# Patient Record
Sex: Female | Born: 1965 | Race: Black or African American | Hispanic: No | Marital: Married | Smoking: Former smoker
Health system: Southern US, Community
[De-identification: ages and names within clinical notes are randomized; demographics above are authoritative.]

## PROBLEM LIST (undated history)

## (undated) DIAGNOSIS — M329 Systemic lupus erythematosus, unspecified: Secondary | ICD-10-CM

## (undated) DIAGNOSIS — B029 Zoster without complications: Secondary | ICD-10-CM

## (undated) DIAGNOSIS — D649 Anemia, unspecified: Secondary | ICD-10-CM

## (undated) DIAGNOSIS — E785 Hyperlipidemia, unspecified: Secondary | ICD-10-CM

## (undated) DIAGNOSIS — D563 Thalassemia minor: Secondary | ICD-10-CM

## (undated) DIAGNOSIS — I10 Essential (primary) hypertension: Secondary | ICD-10-CM

## (undated) DIAGNOSIS — E119 Type 2 diabetes mellitus without complications: Secondary | ICD-10-CM

## (undated) DIAGNOSIS — IMO0002 Reserved for concepts with insufficient information to code with codable children: Secondary | ICD-10-CM

## (undated) HISTORY — DX: Thalassemia minor: D56.3

## (undated) HISTORY — DX: Systemic lupus erythematosus, unspecified: M32.9

## (undated) HISTORY — PX: ABDOMINAL HYSTERECTOMY: SHX81

---

## 2012-12-24 ENCOUNTER — Emergency Department (HOSPITAL_BASED_OUTPATIENT_CLINIC_OR_DEPARTMENT_OTHER)
Admission: EM | Admit: 2012-12-24 | Discharge: 2012-12-24 | Disposition: A | Discharge status: Home / Self Care | Payer: Managed Care, Other (non HMO) | Attending: Emergency Medicine | Admitting: Emergency Medicine

## 2012-12-24 ENCOUNTER — Encounter (HOSPITAL_BASED_OUTPATIENT_CLINIC_OR_DEPARTMENT_OTHER): Payer: Self-pay | Admitting: Emergency Medicine

## 2012-12-24 DIAGNOSIS — Z90710 Acquired absence of both cervix and uterus: Secondary | ICD-10-CM | POA: Insufficient documentation

## 2012-12-24 DIAGNOSIS — R110 Nausea: Secondary | ICD-10-CM | POA: Insufficient documentation

## 2012-12-24 DIAGNOSIS — R51 Headache: Secondary | ICD-10-CM | POA: Insufficient documentation

## 2012-12-24 DIAGNOSIS — Z3202 Encounter for pregnancy test, result negative: Secondary | ICD-10-CM | POA: Insufficient documentation

## 2012-12-24 DIAGNOSIS — R109 Unspecified abdominal pain: Secondary | ICD-10-CM

## 2012-12-24 DIAGNOSIS — R079 Chest pain, unspecified: Secondary | ICD-10-CM | POA: Insufficient documentation

## 2012-12-24 DIAGNOSIS — D649 Anemia, unspecified: Secondary | ICD-10-CM | POA: Insufficient documentation

## 2012-12-24 DIAGNOSIS — R1084 Generalized abdominal pain: Secondary | ICD-10-CM | POA: Insufficient documentation

## 2012-12-24 LAB — LIPASE, BLOOD: Lipase: 25 U/L (ref 11–59)

## 2012-12-24 LAB — CBC WITH DIFFERENTIAL/PLATELET
Basophils Absolute: 0 10*3/uL (ref 0.0–0.1)
Hemoglobin: 10.1 g/dL — ABNORMAL LOW (ref 12.0–15.0)
Lymphocytes Relative: 14 % (ref 12–46)
Lymphs Abs: 0.5 10*3/uL — ABNORMAL LOW (ref 0.7–4.0)
MCH: 23.1 pg — ABNORMAL LOW (ref 26.0–34.0)
Monocytes Relative: 7 % (ref 3–12)
Neutro Abs: 2.9 10*3/uL (ref 1.7–7.7)
Neutrophils Relative %: 79 % — ABNORMAL HIGH (ref 43–77)
RBC: 4.37 MIL/uL (ref 3.87–5.11)
RDW: 15.6 % — ABNORMAL HIGH (ref 11.5–15.5)

## 2012-12-24 LAB — COMPREHENSIVE METABOLIC PANEL
AST: 21 U/L (ref 0–37)
Alkaline Phosphatase: 60 U/L (ref 39–117)
BUN: 14 mg/dL (ref 6–23)
CO2: 26 mEq/L (ref 19–32)
Calcium: 10.3 mg/dL (ref 8.4–10.5)
Chloride: 100 mEq/L (ref 96–112)
GFR calc Af Amer: 88 mL/min — ABNORMAL LOW (ref >90)
Glucose, Bld: 93 mg/dL (ref 70–99)
Potassium: 3.9 mEq/L (ref 3.5–5.1)
Sodium: 137 mEq/L (ref 135–145)
Total Bilirubin: 0.2 mg/dL — ABNORMAL LOW (ref 0.3–1.2)

## 2012-12-24 LAB — URINALYSIS, ROUTINE W REFLEX MICROSCOPIC
Bilirubin Urine: NEGATIVE
Ketones, ur: NEGATIVE mg/dL
Nitrite: NEGATIVE
Specific Gravity, Urine: 1.007 (ref 1.005–1.030)
pH: 6.5 (ref 5.0–8.0)

## 2012-12-24 MED ORDER — ONDANSETRON HCL 4 MG/2ML IJ SOLN
4.0000 mg | Freq: Once | INTRAMUSCULAR | Status: AC
  Administered 2012-12-24: 4 mg via INTRAVENOUS
  Filled 2012-12-24: qty 2

## 2012-12-24 MED ORDER — HYDROMORPHONE HCL PF 1 MG/ML IJ SOLN
0.5000 mg | Freq: Once | INTRAMUSCULAR | Status: AC
  Administered 2012-12-24: 0.5 mg via INTRAVENOUS
  Filled 2012-12-24: qty 1

## 2012-12-24 MED ORDER — SODIUM CHLORIDE 0.9 % IV SOLN
1000.0000 mL | Freq: Once | INTRAVENOUS | Status: AC
  Administered 2012-12-24: 1000 mL via INTRAVENOUS

## 2012-12-24 MED ORDER — ONDANSETRON 4 MG PO TBDP: 4.0000 mg | ORAL_TABLET | Freq: Three times a day (TID) | ORAL | Status: DC | PRN

## 2012-12-24 NOTE — ED Notes (Signed)
Pt c/o abd pain with nausea and h/a x 2 days

## 2012-12-24 NOTE — ED Provider Notes (Signed)
CSN: [id number]     Arrival date & time 12/24/12  1841 History   First MD Initiated Contact with Patient 12/24/12 1853     This chart was scribed for Gerhard Munch, MD by Ellin Mayhew, ED Scribe and Bennett Scrape, ED Scribe. This patient was seen in room MH05/MH05 and the patient's care was started at 7:06 PM.  Chief Complaint  Patient presents with  . Abdominal Pain   The history is provided by the patient. No language interpreter was used.    HPI Comments: Stephanie Myers is a 47 y.o. female who presents to the Emergency Department complaining of intermittent, diffuse abdominal pain described as soreness with associated HA that began two days ago. She rates the abdominal pain as a 4/10. She has taken ibuprofen for her HA but has experienced intermittent nausea without vomiting. Patient states that she has begun to feel some relief from the nausea beginning yesterday. She reports taking 2 Meclizine pills about 2 hours ago with no improvement. Patient has been around sick contacts with flu-like symptoms around her house. She denies any fever, vomiting, diarrhea, rash, leg swelling, urinary sxs or SOB. Patient has had mild intermittent upper chest pain that she deems "nothing serious". She denies drinking and/or smoking. Patient has had an abdominal hysterectomy in 2005-2006. She has no h/o chronic medical conditions.  History reviewed. No pertinent past medical history. Past Surgical History  Procedure Laterality Date  . Abdominal hysterectomy     History reviewed. No pertinent family history. History  Substance Use Topics  . Smoking status: Never Smoker   . Smokeless tobacco: Not on file  . Alcohol Use: No   OB History   Grav Para Term Preterm Abortions TAB SAB Ect Mult Living                 Review of Systems  A complete 10 system review of systems was obtained and all systems are negative except as noted in the HPI and PMH.   Allergies  Review of patient's allergies  indicates no known allergies.  Home Medications  No current outpatient prescriptions on file. Triage Vitals; BP 151/96  Pulse 81  Temp(Src) 98.6 F (37 C) (Oral)  Resp 16  Ht 5\' 4"  (1.626 m)  Wt 180 lb (81.647 kg)  BMI 30.88 kg/m2  SpO2 100%  Physical Exam  Nursing note and vitals reviewed. Constitutional: She is oriented to person, place, and time. She appears well-developed and well-nourished. No distress.  HENT:  Head: Normocephalic and atraumatic.  Eyes: Conjunctivae are normal. Pupils are equal, round, and reactive to light.  Neck: Normal range of motion. Neck supple.  Cardiovascular: Normal rate, regular rhythm and normal heart sounds.   Pulmonary/Chest: Effort normal and breath sounds normal.  Abdominal: Soft. Bowel sounds are normal. There is no tenderness.  Musculoskeletal: She exhibits no edema and no tenderness.  Neurological: She is alert and oriented to person, place, and time.  Skin: No rash noted.     ED Course  Procedures (including critical care time)  Medications  ondansetron (ZOFRAN) injection 4 mg (4 mg Intravenous Given 12/24/12 1952)  0.9 %  sodium chloride infusion (1,000 mLs Intravenous New Bag/Given 12/24/12 1943)  HYDROmorphone (DILAUDID) injection 0.5 mg (0.5 mg Intravenous Given 12/24/12 1955)   DIAGNOSTIC STUDIES: Oxygen Saturation is 100% on room air, normal by my interpretation.    COORDINATION OF CARE: 7:12 PM-Discussed treatment plans to order a UA and pain medication with patient at bedside and patient  agrees.  Labs Review Labs Reviewed  CBC WITH DIFFERENTIAL - Abnormal; Notable for the following:    WBC 3.7 (*)    Hemoglobin 10.1 (*)    HCT 32.2 (*)    MCV 73.7 (*)    MCH 23.1 (*)    RDW 15.6 (*)    Neutrophils Relative % 79 (*)    Lymphs Abs 0.5 (*)    All other components within normal limits  COMPREHENSIVE METABOLIC PANEL - Abnormal; Notable for the following:    Total Bilirubin 0.2 (*)    GFR calc non Af Amer 76 (*)     GFR calc Af Amer 88 (*)    All other components within normal limits  URINALYSIS, ROUTINE W REFLEX MICROSCOPIC  LIPASE, BLOOD  PREGNANCY, URINE   Imaging Review No results found.  EKG Interpretation   None      9:07 PM Patient reassessed.  She states that she is feeling better.  She, her husband and I had a lengthy conversation will results, including her evidence of anemia.  She has a PMD with whom she can f/u.  MDM  No diagnosis found.  I personally performed the services described in this documentation, which was scribed in my presence. The recorded information has been reviewed and is accurate.   Patient presents with several days of generalized abdominal discomfort, nausea. On exam she is awake alert, hemodynamically stable, afebrile with a soft, non-peritoneal abdomen.  Patient had some improvement here.  Patient's labs are notable for no leukocytosis, no significant electrolyte abnormalities.  Patient does have evidence of anemia, which we discussed at length.  With some improvement here, with no evidence of decompensation, patient was discharged in stable condition with primary care followup.  She will be started on multivitamin, iron, antinausea medication.  Gerhard Munch, MD 12/24/12 2109

## 2013-07-03 ENCOUNTER — Emergency Department (HOSPITAL_BASED_OUTPATIENT_CLINIC_OR_DEPARTMENT_OTHER): Payer: Managed Care, Other (non HMO)

## 2013-07-03 ENCOUNTER — Emergency Department (HOSPITAL_BASED_OUTPATIENT_CLINIC_OR_DEPARTMENT_OTHER)
Admission: EM | Admit: 2013-07-03 | Discharge: 2013-07-03 | Disposition: A | Discharge status: Home / Self Care | Payer: Managed Care, Other (non HMO) | Attending: Emergency Medicine | Admitting: Emergency Medicine

## 2013-07-03 ENCOUNTER — Encounter (HOSPITAL_BASED_OUTPATIENT_CLINIC_OR_DEPARTMENT_OTHER): Payer: Self-pay | Admitting: Emergency Medicine

## 2013-07-03 DIAGNOSIS — I10 Essential (primary) hypertension: Secondary | ICD-10-CM | POA: Insufficient documentation

## 2013-07-03 DIAGNOSIS — J159 Unspecified bacterial pneumonia: Secondary | ICD-10-CM | POA: Insufficient documentation

## 2013-07-03 DIAGNOSIS — J189 Pneumonia, unspecified organism: Secondary | ICD-10-CM

## 2013-07-03 DIAGNOSIS — D649 Anemia, unspecified: Secondary | ICD-10-CM | POA: Insufficient documentation

## 2013-07-03 DIAGNOSIS — Z79899 Other long term (current) drug therapy: Secondary | ICD-10-CM | POA: Insufficient documentation

## 2013-07-03 DIAGNOSIS — Z7982 Long term (current) use of aspirin: Secondary | ICD-10-CM | POA: Insufficient documentation

## 2013-07-03 DIAGNOSIS — E119 Type 2 diabetes mellitus without complications: Secondary | ICD-10-CM | POA: Insufficient documentation

## 2013-07-03 HISTORY — DX: Anemia, unspecified: D64.9

## 2013-07-03 HISTORY — DX: Essential (primary) hypertension: I10

## 2013-07-03 HISTORY — DX: Type 2 diabetes mellitus without complications: E11.9

## 2013-07-03 LAB — CBC
HEMATOCRIT: 29.5 % — AB (ref 36.0–46.0)
HEMOGLOBIN: 9.4 g/dL — AB (ref 12.0–15.0)
MCH: 23 pg — ABNORMAL LOW (ref 26.0–34.0)
MCHC: 31.9 g/dL (ref 30.0–36.0)
MCV: 72.1 fL — AB (ref 78.0–100.0)
Platelets: 217 10*3/uL (ref 150–400)
RBC: 4.09 MIL/uL (ref 3.87–5.11)
RDW: 16 % — AB (ref 11.5–15.5)
WBC: 4.7 10*3/uL (ref 4.0–10.5)

## 2013-07-03 LAB — TROPONIN I

## 2013-07-03 LAB — PRO B NATRIURETIC PEPTIDE: Pro B Natriuretic peptide (BNP): 128.3 pg/mL — ABNORMAL HIGH (ref 0–125)

## 2013-07-03 MED ORDER — LEVOFLOXACIN 500 MG PO TABS: 500.0000 mg | ORAL_TABLET | Freq: Every day | ORAL | Status: AC

## 2013-07-03 MED ORDER — ASPIRIN 81 MG PO CHEW
324.0000 mg | CHEWABLE_TABLET | Freq: Once | ORAL | Status: AC
  Administered 2013-07-03: 324 mg via ORAL
  Filled 2013-07-03: qty 4

## 2013-07-03 MED ORDER — LEVOFLOXACIN 500 MG PO TABS
500.0000 mg | ORAL_TABLET | Freq: Once | ORAL | Status: AC
  Administered 2013-07-03: 500 mg via ORAL
  Filled 2013-07-03: qty 1

## 2013-07-03 NOTE — ED Provider Notes (Signed)
CSN: [id number]     Arrival date & time 07/03/13  0703 History   First MD Initiated Contact with Patient 07/03/13 [phone]     Chief Complaint  Patient presents with  . Chest Pain  . Fever      HPI  Patient presents with concerns of fevers, chills, chest pain, mild dyspnea. Symptoms began approximately one day ago.  Since onset she has had all the aforementioned symptoms. Chest pain is anterior, pressure like, diffuse. No relief with anything. Patient has not taken more than OTC medication for relief. Patient does not smoke, does not drink, has no cardiac history, no family history of early cardiac disease, no travel history, no history of thromboembolic processes.   Past Medical History  Diagnosis Date  . Hypertension   . Diabetes mellitus without complication   . Anemia    Past Surgical History  Procedure Laterality Date  . Abdominal hysterectomy     No family history on file. History  Substance Use Topics  . Smoking status: Never Smoker   . Smokeless tobacco: Not on file  . Alcohol Use: No   OB History   Grav Para Term Preterm Abortions TAB SAB Ect Mult Living                 Review of Systems  Constitutional:       Per HPI, otherwise negative  HENT:       Per HPI, otherwise negative  Respiratory:       Per HPI, otherwise negative  Cardiovascular:       Per HPI, otherwise negative  Gastrointestinal: Negative for vomiting.  Endocrine:       Negative aside from HPI  Genitourinary:       Neg aside from HPI   Musculoskeletal:       Per HPI, otherwise negative  Skin: Negative.   Neurological: Negative for syncope.      Allergies  Review of patient's allergies indicates no known allergies.  Home Medications   Prior to Admission medications   Medication Sig Start Date End Date Taking? Authorizing Provider  aspirin 81 MG tablet Take 81 mg by mouth daily.   Yes Historical Provider, MD  cetirizine (ZYRTEC) 10 MG tablet Take 10 mg by mouth daily.   Yes  Historical Provider, MD  ferrous sulfate 325 (65 FE) MG tablet Take 325 mg by mouth daily with breakfast.   Yes Historical Provider, MD  levofloxacin (LEVAQUIN) 500 MG tablet Take 1 tablet (500 mg total) by mouth daily. 07/04/13 07/09/13  Gerhard Munchobert Lockwood, MD   BP 140/94  Pulse 84  Temp(Src) 99.8 F (37.7 C) (Oral)  Resp 16  Ht 5\' 4"  (1.626 m)  Wt 180 lb (81.647 kg)  BMI 30.88 kg/m2  SpO2 99% Physical Exam  Nursing note and vitals reviewed. Constitutional: She is oriented to person, place, and time. She appears well-developed and well-nourished. No distress.  HENT:  Head: Normocephalic and atraumatic.  Eyes: Conjunctivae and EOM are normal.  Cardiovascular: Normal rate and regular rhythm.   Pulmonary/Chest: Effort normal and breath sounds normal. No stridor. No respiratory distress.  Abdominal: She exhibits no distension.  Musculoskeletal: She exhibits no edema.  Neurological: She is alert and oriented to person, place, and time. No cranial nerve deficit.  Skin: Skin is warm and dry.  Psychiatric: She has a normal mood and affect.    ED Course  Procedures (including critical care time) Labs Review Labs Reviewed  CBC - Abnormal; Notable for the  following:    Hemoglobin 9.4 (*)    HCT 29.5 (*)    MCV 72.1 (*)    MCH 23.0 (*)    RDW 16.0 (*)    All other components within normal limits  PRO B NATRIURETIC PEPTIDE - Abnormal; Notable for the following:    Pro B Natriuretic peptide (BNP) 128.3 (*)    All other components within normal limits  TROPONIN I    Imaging Review Dg Chest 2 View  07/03/2013   CLINICAL DATA:  Chest pain with shortness of breath.  EXAM: CHEST  2 VIEW  COMPARISON:  None.  FINDINGS: The lungs are mildly hypoinflated. Increased lung markings are present in the left lower lobe. The cardiac silhouette is top-normal in size. The pulmonary vascularity is not engorged. There is no pleural effusion. The bony thorax is unremarkable.  IMPRESSION: Left lower lobe  atelectasis or early pneumonia.  No CHF.   Electronically Signed   By: David  [place]   On: 07/03/2013 07:37   I reviewed the x-ray, agree with the interpretation. I discussed and demonstrated with the patient and her daughter.     EKG Interpretation   Date/Time:  Friday July 03 2013 07:12:44 EDT Ventricular Rate:  87 PR Interval:  164 QRS Duration: 70 QT Interval:  358 QTC Calculation: 430 R Axis:   60 Text Interpretation:  Normal sinus rhythm Normal ECG Sinus rhythm Normal  ECG Confirmed by Gerhard MunchLOCKWOOD, ROBERT  MD ([id number]) on 07/03/2013 8:20:47 AM      MDM   Final diagnoses:  CAP (community acquired pneumonia)    Patient presents with one day of fever, dyspnea, chest pressure. Patient has no ischemic changes on EKG, and negative troponin, and now she has risk factors hypertension diabetes, with the passage of one day since onset of symptoms, these findings likely reflect the absence of ongoing coronary ischemia. Patient has no risk factors for PE, and is not hypoxic, tachycardic. The description of fevers, chills, mild dyspnea and chest pressure, the patient's x-ray findings of pneumonia seems causative. Absent distress the patient is appropriate for further evaluation and management as an outpatient.  Patient has a primary care physician with whom she can followup after initiation of antibiotics here.    Gerhard Munchobert Lockwood, MD 07/03/13 [phone]

## 2013-07-03 NOTE — ED Notes (Signed)
Mid upper chest pain since last night with fever, sob.  Pt states she is having some swelling in hands.  Pain increases with deep breath.  No recent travel.  No edema in legs.

## 2013-07-03 NOTE — Discharge Instructions (Signed)
As discussed, today's evaluation has resulted in a diagnosis of pneumonia.  It is important that you monitor your condition carefully, take all medication as directed, and be sure to followup with your primary care physician next week.  Return here for any concerning changes in your condition.

## 2013-07-21 ENCOUNTER — Emergency Department (HOSPITAL_BASED_OUTPATIENT_CLINIC_OR_DEPARTMENT_OTHER): Payer: Managed Care, Other (non HMO)

## 2013-07-21 ENCOUNTER — Emergency Department (HOSPITAL_BASED_OUTPATIENT_CLINIC_OR_DEPARTMENT_OTHER)
Admission: EM | Admit: 2013-07-21 | Discharge: 2013-07-21 | Disposition: A | Discharge status: Home / Self Care | Payer: Managed Care, Other (non HMO) | Attending: Emergency Medicine | Admitting: Emergency Medicine

## 2013-07-21 ENCOUNTER — Encounter (HOSPITAL_BASED_OUTPATIENT_CLINIC_OR_DEPARTMENT_OTHER): Payer: Self-pay | Admitting: Emergency Medicine

## 2013-07-21 DIAGNOSIS — D649 Anemia, unspecified: Secondary | ICD-10-CM | POA: Insufficient documentation

## 2013-07-21 DIAGNOSIS — R071 Chest pain on breathing: Secondary | ICD-10-CM | POA: Insufficient documentation

## 2013-07-21 DIAGNOSIS — I10 Essential (primary) hypertension: Secondary | ICD-10-CM | POA: Insufficient documentation

## 2013-07-21 DIAGNOSIS — E119 Type 2 diabetes mellitus without complications: Secondary | ICD-10-CM | POA: Insufficient documentation

## 2013-07-21 DIAGNOSIS — Z79899 Other long term (current) drug therapy: Secondary | ICD-10-CM | POA: Insufficient documentation

## 2013-07-21 DIAGNOSIS — Z8701 Personal history of pneumonia (recurrent): Secondary | ICD-10-CM | POA: Insufficient documentation

## 2013-07-21 DIAGNOSIS — R0789 Other chest pain: Secondary | ICD-10-CM

## 2013-07-21 DIAGNOSIS — Z7982 Long term (current) use of aspirin: Secondary | ICD-10-CM | POA: Insufficient documentation

## 2013-07-21 LAB — CBC WITH DIFFERENTIAL/PLATELET
BAND NEUTROPHILS: 11 % — AB (ref 0–10)
BLASTS: 0 %
Basophils Absolute: 0 10*3/uL (ref 0.0–0.1)
Basophils Relative: 0 % (ref 0–1)
EOS ABS: 0.2 10*3/uL (ref 0.0–0.7)
EOS PCT: 3 % (ref 0–5)
HEMATOCRIT: 31.2 % — AB (ref 36.0–46.0)
Hemoglobin: 9.9 g/dL — ABNORMAL LOW (ref 12.0–15.0)
Lymphocytes Relative: 16 % (ref 12–46)
Lymphs Abs: 0.8 10*3/uL (ref 0.7–4.0)
MCH: 22.8 pg — ABNORMAL LOW (ref 26.0–34.0)
MCHC: 31.7 g/dL (ref 30.0–36.0)
MCV: 71.7 fL — ABNORMAL LOW (ref 78.0–100.0)
MONO ABS: 0.3 10*3/uL (ref 0.1–1.0)
MONOS PCT: 6 % (ref 3–12)
Metamyelocytes Relative: 0 %
Myelocytes: 5 %
Neutro Abs: 3.9 10*3/uL (ref 1.7–7.7)
Neutrophils Relative %: 59 % (ref 43–77)
Platelets: 280 10*3/uL (ref 150–400)
Promyelocytes Absolute: 0 %
RBC: 4.35 MIL/uL (ref 3.87–5.11)
RDW: 17 % — ABNORMAL HIGH (ref 11.5–15.5)
WBC: 5.2 10*3/uL (ref 4.0–10.5)
nRBC: 0 /100 WBC

## 2013-07-21 LAB — BASIC METABOLIC PANEL
Anion gap: 14 (ref 5–15)
BUN: 17 mg/dL (ref 6–23)
CO2: 26 mEq/L (ref 19–32)
Calcium: 9.5 mg/dL (ref 8.4–10.5)
Chloride: 98 mEq/L (ref 96–112)
Creatinine, Ser: 0.9 mg/dL (ref 0.50–1.10)
GFR calc Af Amer: 87 mL/min — ABNORMAL LOW (ref >90)
GFR calc non Af Amer: 75 mL/min — ABNORMAL LOW (ref >90)
Glucose, Bld: 129 mg/dL — ABNORMAL HIGH (ref 70–99)
POTASSIUM: 3.9 meq/L (ref 3.7–5.3)
Sodium: 138 mEq/L (ref 137–147)

## 2013-07-21 LAB — TROPONIN I: Troponin I: <0.3 ng/mL (ref <0.30)

## 2013-07-21 LAB — D-DIMER, QUANTITATIVE: D-Dimer, Quant: 1.23 ug/mL-FEU — ABNORMAL HIGH (ref 0.00–0.48)

## 2013-07-21 MED ORDER — IOHEXOL 350 MG/ML SOLN
100.0000 mL | Freq: Once | INTRAVENOUS | Status: AC | PRN
  Administered 2013-07-21: 100 mL via INTRAVENOUS

## 2013-07-21 MED ORDER — HYDROCODONE-ACETAMINOPHEN 5-325 MG PO TABS: 1.0000 | ORAL_TABLET | Freq: Four times a day (QID) | ORAL | Status: DC | PRN

## 2013-07-21 MED ORDER — IBUPROFEN 600 MG PO TABS
600.0000 mg | ORAL_TABLET | Freq: Four times a day (QID) | ORAL | Status: DC | PRN
Start: 2013-07-21 — End: 2014-07-16

## 2013-07-21 MED ORDER — KETOROLAC TROMETHAMINE 60 MG/2ML IM SOLN
60.0000 mg | Freq: Once | INTRAMUSCULAR | Status: AC
  Administered 2013-07-21: 60 mg via INTRAMUSCULAR
  Filled 2013-07-21: qty 2

## 2013-07-21 NOTE — ED Notes (Signed)
Recent dx with PNA. Pt went back to follow up with PMD, now having recurrent CP in right chest and radiating down right arm. Reports [facility].

## 2013-07-21 NOTE — Discharge Instructions (Signed)
Costochondritis Costochondritis, sometimes called Tietze syndrome, is a swelling and irritation (inflammation) of the tissue (cartilage) that connects your ribs with your breastbone (sternum). It causes pain in the chest and rib area. Costochondritis usually goes away on its own over time. It can take up to 6 weeks or longer to get better, especially if you are unable to limit your activities. CAUSES  Some cases of costochondritis have no known cause. Possible causes include:  Injury (trauma).  Exercise or activity such as lifting.  Severe coughing. SIGNS AND SYMPTOMS  Pain and tenderness in the chest and rib area.  Pain that gets worse when coughing or taking deep breaths.  Pain that gets worse with specific movements. DIAGNOSIS  Your health care provider will do a physical exam and ask about your symptoms. Chest X-rays or other tests may be done to rule out other problems. TREATMENT  Costochondritis usually goes away on its own over time. Your health care provider may prescribe medicine to help relieve pain. HOME CARE INSTRUCTIONS   Avoid exhausting physical activity. Try not to strain your ribs during normal activity. This would include any activities using chest, abdominal, and side muscles, especially if heavy weights are used.  Apply ice to the affected area for the first 2 days after the pain begins.  Put ice in a plastic bag.  Place a towel between your skin and the bag.  Leave the ice on for 20 minutes, 2-3 times a day.  Only take over-the-counter or prescription medicines as directed by your health care provider. SEEK MEDICAL CARE IF:  You have redness or swelling at the rib joints. These are signs of infection.  Your pain does not go away despite rest or medicine. SEEK IMMEDIATE MEDICAL CARE IF:   Your pain increases or you are very uncomfortable.  You have shortness of breath or difficulty breathing.  You cough up blood.  You have worse chest pains,  sweating, or vomiting.  You have a fever or persistent symptoms for more than 2-3 days.  You have a fever and your symptoms suddenly get worse. MAKE SURE YOU:   Understand these instructions.  Will watch your condition.  Will get help right away if you are not doing well or get worse. Document Released: 10/11/2004 Document Revised: 10/22/2012 Document Reviewed: 08/05/2012 [employer] Patient Information 2015 [employer]. This information is not intended to replace advice given to you by your health care provider. Make sure you discuss any questions you have with your health care provider.  

## 2013-07-21 NOTE — ED Provider Notes (Signed)
CSN: [id number]     Arrival date & time 07/21/13  1632 History   First MD Initiated Contact with Patient 07/21/13 1647     Chief Complaint  Patient presents with  . Chest Pain     (Consider location/radiation/quality/duration/timing/severity/associated sxs/prior Treatment) HPI  This is a 48 year old female who presents with chest pain. Recent diagnosis of pneumonia. Patient states that she's been having ongoing right-sided chest pain for the last week. She states that she did have chest pain from pneumonia but was more centrally located. The pain is worse with breathing and movement of the right arm. He has been relatively constant. It is sharp and radiates to the back. Currently patient's pain is 8/10. She has not taken anything for pain at home. She denies any fevers or cough. She denies any history of blood clots or lower extremity swelling.  Past Medical History  Diagnosis Date  . Hypertension   . Diabetes mellitus without complication   . Anemia    Past Surgical History  Procedure Laterality Date  . Abdominal hysterectomy     No family history on file. History  Substance Use Topics  . Smoking status: Never Smoker   . Smokeless tobacco: Not on file  . Alcohol Use: No   OB History   Grav Para Term Preterm Abortions TAB SAB Ect Mult Living                 Review of Systems  Constitutional: Negative for fever.  Respiratory: Positive for chest tightness. Negative for cough and shortness of breath.   Cardiovascular: Positive for chest pain. Negative for palpitations and leg swelling.  Gastrointestinal: Negative for nausea and vomiting.  Genitourinary: Negative for dysuria.  Musculoskeletal: Negative for back pain.  Skin: Negative for wound.  Neurological: Negative for headaches.  All other systems reviewed and are negative.     Allergies  Review of patient's allergies indicates no known allergies.  Home Medications   Prior to Admission medications   Medication  Sig Start Date End Date Taking? Authorizing Provider  aspirin 81 MG tablet Take 81 mg by mouth daily.    Historical Provider, MD  cetirizine (ZYRTEC) 10 MG tablet Take 10 mg by mouth daily.    Historical Provider, MD  ferrous sulfate 325 (65 FE) MG tablet Take 325 mg by mouth daily with breakfast.    Historical Provider, MD  HYDROcodone-acetaminophen (NORCO/VICODIN) 5-325 MG per tablet Take 1 tablet by mouth every 6 (six) hours as needed. 07/21/13   Shon Batonourtney F Horton, MD  ibuprofen (ADVIL,MOTRIN) 600 MG tablet Take 1 tablet (600 mg total) by mouth every 6 (six) hours as needed. 07/21/13   Shon Batonourtney F Horton, MD   BP 128/88  Pulse 85  Temp(Src) 98.2 F (36.8 C) (Oral)  Resp 18  SpO2 100% Physical Exam  Nursing note and vitals reviewed. Constitutional: She is oriented to person, place, and time. She appears well-developed and well-nourished. No distress.  HENT:  Head: Normocephalic and atraumatic.  Neck: Neck supple.  Cardiovascular: Normal rate, regular rhythm and normal heart sounds.   No murmur heard. Pulmonary/Chest: Effort normal. No respiratory distress. She has no wheezes. She exhibits tenderness.  Abdominal: Soft. Bowel sounds are normal. There is no tenderness. There is no rebound.  Musculoskeletal: She exhibits no edema.  Neurological: She is alert and oriented to person, place, and time.  Skin: Skin is warm and dry.  Psychiatric: She has a normal mood and affect.    ED Course  Procedures (  including critical care time) Labs Review Labs Reviewed  CBC WITH DIFFERENTIAL - Abnormal; Notable for the following:    Hemoglobin 9.9 (*)    HCT 31.2 (*)    MCV 71.7 (*)    MCH 22.8 (*)    RDW 17.0 (*)    Band Neutrophils 11 (*)    All other components within normal limits  BASIC METABOLIC PANEL - Abnormal; Notable for the following:    Glucose, Bld 129 (*)    GFR calc non Af Amer 75 (*)    GFR calc Af Amer 87 (*)    All other components within normal limits  D-DIMER, QUANTITATIVE  - Abnormal; Notable for the following:    D-Dimer, Quant 1.23 (*)    All other components within normal limits  TROPONIN I  PATHOLOGIST SMEAR REVIEW    Imaging Review Dg Chest 2 View  07/21/2013   CLINICAL DATA:  Chest pain  EXAM: CHEST  2 VIEW  COMPARISON:  07/03/2013  FINDINGS: The heart size and mediastinal contours are within normal limits. Both lungs are clear. The visualized skeletal structures are unremarkable.  IMPRESSION: No active cardiopulmonary disease.   Electronically Signed   By: Charlett NoseKevin  Dover M.D.   On: 07/21/2013 17:57   Ct Angio Chest W/cm &/or Wo Cm  07/21/2013   CLINICAL DATA:  Chest pain.  Elevated D-dimer.  EXAM: CT ANGIOGRAPHY CHEST WITH CONTRAST  TECHNIQUE: Multidetector CT imaging of the chest was performed using the standard protocol during bolus administration of intravenous contrast. Multiplanar CT image reconstructions and MIPs were obtained to evaluate the vascular anatomy.  CONTRAST:  100mL OMNIPAQUE IOHEXOL 350 MG/ML SOLN  COMPARISON:  Chest x-ray earlier today. No filling defects in the pulmonary arteries to suggest pulmonary emboli. Heart is normal size. Aorta is normal caliber. Scattered bilateral small and borderline sized axillary lymph nodes, likely reactive. No mediastinal or hilar adenopathy.  Lungs are clear. No effusions. Imaging into the upper abdomen shows no acute findings. No acute bony abnormality.  FINDINGS: No acute findings.  No evidence of pulmonary embolus.  Small and borderline sized bilateral axillary lymph nodes, likely reactive. These could be followed clinically.  Review of the MIP images confirms the above findings.   Electronically Signed   By: Charlett NoseKevin  Dover M.D.   On: 07/21/2013 19:24     EKG Interpretation   Date/Time:  Tuesday July 21 2013 16:37:14 EDT Ventricular Rate:  95 PR Interval:  138 QRS Duration: 66 QT Interval:  336 QTC Calculation: 422 R Axis:   41 Text Interpretation:  Normal sinus rhythm Possible Left atrial enlargement   Borderline ECG NO change from prior Confirmed by HORTON  MD, COURTNEY  ([id number]) on 07/21/2013 4:56:06 PM      MDM   Final diagnoses:  Costochondral chest pain    Patient presents with chest pain. She is nontoxic on exam. Vital signs are reassuring. EKG is nonischemic and unchanged from prior. Chest x-ray does appear to be reproducible on exam. However, patient also reports pleuritic nature pain. Screening d-dimer sent. Lab work including troponin is reassuring. Chest x-ray is also reassuring. D-dimer is positive at 1.23. CT scan she'll obtained and is negative for blood clot. Patient was given IM Toradol and reports improvement of her pain. Suspect costochondritis versus chest wall pain. Discussed with the patient use of ibuprofen as an anti-inflammatory for pain relief. She will also be given a short course of Norco.  After history, exam, and medical workup I feel the  patient has been appropriately medically screened and is safe for discharge home. Pertinent diagnoses were discussed with the patient. Patient was given return precautions.     Shon Baton, MD 07/21/13 [id number]

## 2013-07-22 LAB — PATHOLOGIST SMEAR REVIEW

## 2014-01-13 ENCOUNTER — Emergency Department (HOSPITAL_BASED_OUTPATIENT_CLINIC_OR_DEPARTMENT_OTHER): Payer: Managed Care, Other (non HMO)

## 2014-01-13 ENCOUNTER — Emergency Department (HOSPITAL_BASED_OUTPATIENT_CLINIC_OR_DEPARTMENT_OTHER)
Admission: EM | Admit: 2014-01-13 | Discharge: 2014-01-13 | Disposition: A | Discharge status: Home / Self Care | Payer: Managed Care, Other (non HMO) | Attending: Emergency Medicine | Admitting: Emergency Medicine

## 2014-01-13 ENCOUNTER — Encounter (HOSPITAL_BASED_OUTPATIENT_CLINIC_OR_DEPARTMENT_OTHER): Payer: Self-pay

## 2014-01-13 DIAGNOSIS — Z79899 Other long term (current) drug therapy: Secondary | ICD-10-CM | POA: Insufficient documentation

## 2014-01-13 DIAGNOSIS — D649 Anemia, unspecified: Secondary | ICD-10-CM | POA: Insufficient documentation

## 2014-01-13 DIAGNOSIS — Z8739 Personal history of other diseases of the musculoskeletal system and connective tissue: Secondary | ICD-10-CM | POA: Insufficient documentation

## 2014-01-13 DIAGNOSIS — Z7982 Long term (current) use of aspirin: Secondary | ICD-10-CM | POA: Insufficient documentation

## 2014-01-13 DIAGNOSIS — E119 Type 2 diabetes mellitus without complications: Secondary | ICD-10-CM | POA: Insufficient documentation

## 2014-01-13 DIAGNOSIS — I10 Essential (primary) hypertension: Secondary | ICD-10-CM | POA: Diagnosis not present

## 2014-01-13 DIAGNOSIS — R079 Chest pain, unspecified: Secondary | ICD-10-CM | POA: Diagnosis present

## 2014-01-13 DIAGNOSIS — Z8619 Personal history of other infectious and parasitic diseases: Secondary | ICD-10-CM | POA: Diagnosis not present

## 2014-01-13 HISTORY — DX: Zoster without complications: B02.9

## 2014-01-13 HISTORY — DX: Hyperlipidemia, unspecified: E78.5

## 2014-01-13 HISTORY — DX: Reserved for concepts with insufficient information to code with codable children: IMO0002

## 2014-01-13 HISTORY — DX: Systemic lupus erythematosus, unspecified: M32.9

## 2014-01-13 LAB — D-DIMER, QUANTITATIVE: D-Dimer, Quant: 1.16 ug/mL-FEU — ABNORMAL HIGH (ref 0.00–0.48)

## 2014-01-13 LAB — CBC WITH DIFFERENTIAL/PLATELET
BASOS ABS: 0 10*3/uL (ref 0.0–0.1)
BASOS PCT: 0 % (ref 0–1)
EOS PCT: 1 % (ref 0–5)
Eosinophils Absolute: 0.1 10*3/uL (ref 0.0–0.7)
HCT: 32.3 % — ABNORMAL LOW (ref 36.0–46.0)
Hemoglobin: 10.2 g/dL — ABNORMAL LOW (ref 12.0–15.0)
Lymphocytes Relative: 23 % (ref 12–46)
Lymphs Abs: 1 10*3/uL (ref 0.7–4.0)
MCH: 22.9 pg — ABNORMAL LOW (ref 26.0–34.0)
MCHC: 31.6 g/dL (ref 30.0–36.0)
MCV: 72.6 fL — ABNORMAL LOW (ref 78.0–100.0)
Monocytes Absolute: 0.2 10*3/uL (ref 0.1–1.0)
Monocytes Relative: 5 % (ref 3–12)
NEUTROS ABS: 3.2 10*3/uL (ref 1.7–7.7)
Neutrophils Relative %: 71 % (ref 43–77)
Platelets: 291 10*3/uL (ref 150–400)
RBC: 4.45 MIL/uL (ref 3.87–5.11)
RDW: 17 % — AB (ref 11.5–15.5)
WBC: 4.5 10*3/uL (ref 4.0–10.5)

## 2014-01-13 LAB — TROPONIN I
Troponin I: <0.03 ng/mL (ref <0.031)
Troponin I: <0.03 ng/mL (ref <0.031)

## 2014-01-13 LAB — BASIC METABOLIC PANEL
ANION GAP: 8 (ref 5–15)
BUN: 17 mg/dL (ref 6–23)
CALCIUM: 9.7 mg/dL (ref 8.4–10.5)
CO2: 25 mmol/L (ref 19–32)
CREATININE: 0.93 mg/dL (ref 0.50–1.10)
Chloride: 102 mEq/L (ref 96–112)
GFR, EST AFRICAN AMERICAN: 84 mL/min — AB (ref >90)
GFR, EST NON AFRICAN AMERICAN: 72 mL/min — AB (ref >90)
Glucose, Bld: 88 mg/dL (ref 70–99)
Potassium: 4 mmol/L (ref 3.5–5.1)
Sodium: 135 mmol/L (ref 135–145)

## 2014-01-13 MED ORDER — IOHEXOL 350 MG/ML SOLN
100.0000 mL | Freq: Once | INTRAVENOUS | Status: AC | PRN
  Administered 2014-01-13: 100 mL via INTRAVENOUS

## 2014-01-13 MED ORDER — SODIUM CHLORIDE 0.9 % IV BOLUS (SEPSIS)
1000.0000 mL | Freq: Once | INTRAVENOUS | Status: AC
  Administered 2014-01-13: 1000 mL via INTRAVENOUS

## 2014-01-13 MED ORDER — KETOROLAC TROMETHAMINE 30 MG/ML IJ SOLN
30.0000 mg | Freq: Once | INTRAMUSCULAR | Status: AC
  Administered 2014-01-13: 30 mg via INTRAVENOUS
  Filled 2014-01-13: qty 1

## 2014-01-13 NOTE — ED Provider Notes (Signed)
TIME SEEN: 1:00 PM  CHIEF COMPLAINT: Chest pain  HPI: Pt is a 48 y.o. F with history of hypertension, diabetes, hyperlipidemia, lupus who presents to the emergency department with complaints of left-sided sharp chest pain that started at 7 AM this morning and has been constant. Pain is worse with movement and deep inspiration. It is not exertional. She does have some intermittent shortness of breath. No nausea, diaphoresis or dizziness. States the pain does radiate up into her neck and she has numbness and tingling in her left fingers. Denies a prior history of cardiac disease, stress test or catheterization. Patient was a smoker approximately 7 years but quit 19 years ago. Denies any fever or cough. Denies history of PE or DVT, recent prolonged immobilization such as long flight or hospitalization, fracture, surgery, trauma, exogenous hormone use, lower extremity pain or swelling.  ROS: See HPI Constitutional: no fever  Eyes: no drainage  ENT: no runny nose   Cardiovascular:   chest pain  Resp:  SOB  GI: no vomiting GU: no dysuria Integumentary: no rash  Allergy: no hives  Musculoskeletal: no leg swelling  Neurological: no slurred speech ROS otherwise negative  PAST MEDICAL HISTORY/PAST SURGICAL HISTORY:  Past Medical History  Diagnosis Date  . Hypertension   . Diabetes mellitus without complication   . Anemia   . Hyperlipidemia   . Lupus   . Shingles     MEDICATIONS:  Prior to Admission medications   Medication Sig Start Date End Date Taking? Authorizing Provider  LOSARTAN POTASSIUM PO Take by mouth.   Yes Historical Provider, MD  METFORMIN HCL PO Take by mouth.   Yes Historical Provider, MD  aspirin 81 MG tablet Take 81 mg by mouth daily.    Historical Provider, MD  cetirizine (ZYRTEC) 10 MG tablet Take 10 mg by mouth daily.    Historical Provider, MD  ferrous sulfate 325 (65 FE) MG tablet Take 325 mg by mouth daily with breakfast.    Historical Provider, MD   HYDROcodone-acetaminophen (NORCO/VICODIN) 5-325 MG per tablet Take 1 tablet by mouth every 6 (six) hours as needed. 07/21/13   Shon Batonourtney F Horton, MD  ibuprofen (ADVIL,MOTRIN) 600 MG tablet Take 1 tablet (600 mg total) by mouth every 6 (six) hours as needed. 07/21/13   Shon Batonourtney F Horton, MD    ALLERGIES:  No Known Allergies  SOCIAL HISTORY:  History  Substance Use Topics  . Smoking status: Never Smoker   . Smokeless tobacco: Not on file  . Alcohol Use: No    FAMILY HISTORY: No family history on file.  EXAM: BP 120/77 mmHg  Pulse 100  Temp(Src) 98.4 F (36.9 C) (Oral)  Resp 18  Ht 5\' 4"  (1.626 m)  Wt 180 lb (81.647 kg)  BMI 30.88 kg/m2  SpO2 99% CONSTITUTIONAL: Alert and oriented and responds appropriately to questions. Well-appearing; well-nourished; patient appears very uncomfortable when you ask her to sit upright in bed stating this exacerbates her pain HEAD: Normocephalic EYES: Conjunctivae clear, PERRL ENT: normal nose; no rhinorrhea; moist mucous membranes; pharynx without lesions noted NECK: Supple, no meningismus, no LAD  CARD: Regular and tachycardic; S1 and S2 appreciated; no murmurs, no clicks, no rubs, no gallops CHEST:  mildly tender to palpation under the left breast without crepitus or ecchymosis or deformity - agent states this does mildly reproduce her pain, she also has a 1 x 2 cm fluctuant tender area in the left axilla without drainage and no surrounding warmth or erythema RESP: Normal chest  excursion without splinting or tachypnea; breath sounds clear and equal bilaterally; no wheezes, no rhonchi, no rales, no hypoxia or respiratory distress ABD/GI: Normal bowel sounds; non-distended; soft, non-tender, no rebound, no guarding BACK:  The back appears normal and is non-tender to palpation, there is no CVA tenderness EXT: Normal ROM in all joints; non-tender to palpation; no edema; normal capillary refill; no cyanosis    SKIN: Normal color for age and race;  warm NEURO: Moves all extremities equally PSYCH: The patient's mood and manner are appropriate. Grooming and personal hygiene are appropriate.  MEDICAL DECISION MAKING: Patient here with what appears to be musculoskeletal chest pain however she does have multiple risk factors for ACS. She is hemodynamically stable and her EKG is completely normal. Pain has been constant since 7 AM without any relief. We'll give Toradol for pain. We'll obtain cardiac labs. Will also obtain a d-dimer given she does have mild tachycardia and has pleuritic chest pain. We'll obtain chest x-ray. Have recommended an IND for the small abscess in her left axilla which at this time she refuses. No signs of surrounding cellulitis.  ED PROGRESS:Troponin negative. D-dimer is slightly elevated. Will obtain a CT of her chest. Chest x-ray clear. Labs otherwise unremarkable.  3:00 PM  Pt's CT scan shows no pulmonary embolus, infiltrate or edema. She does have bilateral axillary adenopathy and this may actually be what is causing the pain and swelling underneath her left axilla. There is no other predominant lymph nodes on exam or CT scan. Have discussed this with patient and that she needs close outpatient follow-up to follow this. She also has decreased attenuation of the left lobe of thyroid and will need a nonemergent thyroid ultrasound. Have discussed these findings with the patient. She reports her chest pain is completely gone after Toradol. Again I suspect that this is musculoskeletal in nature and I have low suspicion for ACS social given her pain is been constant since 7 AM but will obtain a second troponin given she does have multiple risk factors. If negative, anticipate discharge home with close outpatient follow-up. She does have a PCP. She is comfortable with this plan and states she would prefer discharge.   EKG Interpretation  Date/Time:  Wednesday January 13 2014 12:48:40 EST Ventricular Rate:  99 PR  Interval:  158 QRS Duration: 82 QT Interval:  366 QTC Calculation: 469 R Axis:   39 Text Interpretation:  Normal sinus rhythm Normal ECG Confirmed by Yocheved Depner,  DO, Kilee Hedding ([id number]) on 01/13/2014 12:51:20 PM          Layla MawKristen N Maylene Crocker, DO 01/13/14 1626

## 2014-01-13 NOTE — ED Notes (Signed)
Pt with verbal understanding not to take metformin x 48 hrs

## 2014-01-13 NOTE — ED Notes (Signed)
MD at bedside. 

## 2014-01-13 NOTE — ED Notes (Signed)
Patient transported to CT 

## 2014-01-13 NOTE — ED Notes (Signed)
Acute chest pain that started at 0700 this morning while at rest.  Pain is under left breast radiating to left chest wall, left arm and neck and is described as constant, sharp pain. Associated symptoms are "numbness" in fingers that started a few minutes ago.

## 2014-01-13 NOTE — Discharge Instructions (Signed)

## 2014-02-04 ENCOUNTER — Other Ambulatory Visit: Payer: Self-pay

## 2014-02-04 ENCOUNTER — Other Ambulatory Visit: Payer: Self-pay | Admitting: Obstetrics and Gynecology

## 2014-02-04 DIAGNOSIS — R921 Mammographic calcification found on diagnostic imaging of breast: Secondary | ICD-10-CM

## 2014-07-16 ENCOUNTER — Emergency Department (HOSPITAL_BASED_OUTPATIENT_CLINIC_OR_DEPARTMENT_OTHER)
Admission: EM | Admit: 2014-07-16 | Discharge: 2014-07-16 | Disposition: A | Discharge status: Home / Self Care | Payer: BLUE CROSS/BLUE SHIELD | Attending: Emergency Medicine | Admitting: Emergency Medicine

## 2014-07-16 ENCOUNTER — Encounter (HOSPITAL_BASED_OUTPATIENT_CLINIC_OR_DEPARTMENT_OTHER): Payer: Self-pay | Admitting: *Deleted

## 2014-07-16 ENCOUNTER — Emergency Department (HOSPITAL_BASED_OUTPATIENT_CLINIC_OR_DEPARTMENT_OTHER): Payer: BLUE CROSS/BLUE SHIELD

## 2014-07-16 DIAGNOSIS — Y92410 Unspecified street and highway as the place of occurrence of the external cause: Secondary | ICD-10-CM | POA: Diagnosis not present

## 2014-07-16 DIAGNOSIS — Z79899 Other long term (current) drug therapy: Secondary | ICD-10-CM | POA: Insufficient documentation

## 2014-07-16 DIAGNOSIS — S4991XA Unspecified injury of right shoulder and upper arm, initial encounter: Secondary | ICD-10-CM | POA: Insufficient documentation

## 2014-07-16 DIAGNOSIS — Z8739 Personal history of other diseases of the musculoskeletal system and connective tissue: Secondary | ICD-10-CM | POA: Insufficient documentation

## 2014-07-16 DIAGNOSIS — Z8619 Personal history of other infectious and parasitic diseases: Secondary | ICD-10-CM | POA: Diagnosis not present

## 2014-07-16 DIAGNOSIS — E785 Hyperlipidemia, unspecified: Secondary | ICD-10-CM | POA: Diagnosis not present

## 2014-07-16 DIAGNOSIS — Z7982 Long term (current) use of aspirin: Secondary | ICD-10-CM | POA: Diagnosis not present

## 2014-07-16 DIAGNOSIS — Y9389 Activity, other specified: Secondary | ICD-10-CM | POA: Diagnosis not present

## 2014-07-16 DIAGNOSIS — I10 Essential (primary) hypertension: Secondary | ICD-10-CM | POA: Insufficient documentation

## 2014-07-16 DIAGNOSIS — Y998 Other external cause status: Secondary | ICD-10-CM | POA: Insufficient documentation

## 2014-07-16 DIAGNOSIS — Z862 Personal history of diseases of the blood and blood-forming organs and certain disorders involving the immune mechanism: Secondary | ICD-10-CM | POA: Insufficient documentation

## 2014-07-16 DIAGNOSIS — E119 Type 2 diabetes mellitus without complications: Secondary | ICD-10-CM | POA: Insufficient documentation

## 2014-07-16 MED ORDER — HYDROCODONE-ACETAMINOPHEN 5-325 MG PO TABS: 1.0000 | ORAL_TABLET | Freq: Four times a day (QID) | ORAL | Status: DC | PRN

## 2014-07-16 MED ORDER — HYDROCODONE-ACETAMINOPHEN 5-325 MG PO TABS
2.0000 | ORAL_TABLET | Freq: Once | ORAL | Status: AC
  Administered 2014-07-16: 2 via ORAL
  Filled 2014-07-16: qty 2

## 2014-07-16 NOTE — Discharge Instructions (Signed)
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
## 2014-07-21 ENCOUNTER — Encounter (HOSPITAL_BASED_OUTPATIENT_CLINIC_OR_DEPARTMENT_OTHER): Payer: Self-pay | Admitting: *Deleted

## 2014-07-21 ENCOUNTER — Emergency Department (HOSPITAL_BASED_OUTPATIENT_CLINIC_OR_DEPARTMENT_OTHER)
Admission: EM | Admit: 2014-07-21 | Discharge: 2014-07-21 | Disposition: A | Discharge status: Home / Self Care | Payer: BLUE CROSS/BLUE SHIELD | Attending: Emergency Medicine | Admitting: Emergency Medicine

## 2014-07-21 DIAGNOSIS — I10 Essential (primary) hypertension: Secondary | ICD-10-CM | POA: Insufficient documentation

## 2014-07-21 DIAGNOSIS — S199XXA Unspecified injury of neck, initial encounter: Secondary | ICD-10-CM | POA: Insufficient documentation

## 2014-07-21 DIAGNOSIS — S4991XA Unspecified injury of right shoulder and upper arm, initial encounter: Secondary | ICD-10-CM | POA: Insufficient documentation

## 2014-07-21 DIAGNOSIS — Y998 Other external cause status: Secondary | ICD-10-CM | POA: Insufficient documentation

## 2014-07-21 DIAGNOSIS — S299XXA Unspecified injury of thorax, initial encounter: Secondary | ICD-10-CM | POA: Diagnosis present

## 2014-07-21 DIAGNOSIS — Z8619 Personal history of other infectious and parasitic diseases: Secondary | ICD-10-CM | POA: Insufficient documentation

## 2014-07-21 DIAGNOSIS — E785 Hyperlipidemia, unspecified: Secondary | ICD-10-CM | POA: Insufficient documentation

## 2014-07-21 DIAGNOSIS — Z79899 Other long term (current) drug therapy: Secondary | ICD-10-CM | POA: Diagnosis not present

## 2014-07-21 DIAGNOSIS — Z7982 Long term (current) use of aspirin: Secondary | ICD-10-CM | POA: Insufficient documentation

## 2014-07-21 DIAGNOSIS — E119 Type 2 diabetes mellitus without complications: Secondary | ICD-10-CM | POA: Insufficient documentation

## 2014-07-21 DIAGNOSIS — Y9389 Activity, other specified: Secondary | ICD-10-CM | POA: Insufficient documentation

## 2014-07-21 DIAGNOSIS — Z862 Personal history of diseases of the blood and blood-forming organs and certain disorders involving the immune mechanism: Secondary | ICD-10-CM | POA: Diagnosis not present

## 2014-07-21 DIAGNOSIS — Y92410 Unspecified street and highway as the place of occurrence of the external cause: Secondary | ICD-10-CM | POA: Diagnosis not present

## 2014-07-21 DIAGNOSIS — M62838 Other muscle spasm: Secondary | ICD-10-CM | POA: Diagnosis not present

## 2014-07-21 DIAGNOSIS — S0990XA Unspecified injury of head, initial encounter: Secondary | ICD-10-CM | POA: Insufficient documentation

## 2014-07-21 MED ORDER — CYCLOBENZAPRINE HCL 5 MG PO TABS: 5.0000 mg | ORAL_TABLET | Freq: Three times a day (TID) | ORAL | Status: DC | PRN

## 2014-07-21 MED ORDER — NAPROXEN 500 MG PO TABS: 500.0000 mg | ORAL_TABLET | Freq: Two times a day (BID) | ORAL | Status: DC
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
## 2014-07-21 NOTE — ED Notes (Signed)
Directed to pharmacy to pick up prescriptions 

## 2014-07-21 NOTE — ED Notes (Signed)
MD at bedside. 

## 2015-12-03 ENCOUNTER — Other Ambulatory Visit: Payer: Self-pay | Admitting: Rheumatology

## 2015-12-05 ENCOUNTER — Telehealth: Payer: Self-pay | Admitting: Radiology

## 2015-12-05 NOTE — Telephone Encounter (Signed)
     Eye exam was due in Aug  Will call pt Labs 08/2015 Last visit 06/2015 Next visit 12/22/15

## 2015-12-05 NOTE — Telephone Encounter (Signed)
Eye exam was due in Aug  Will call pt Labs 08/2015 Last visit 06/2015 Next visit 12/22/15

## 2015-12-05 NOTE — Telephone Encounter (Signed)
Called patient/ left message for her to call back about eye exam  Ok to refill per Mr Leane Callanwala One month supply

## 2015-12-05 NOTE — Telephone Encounter (Signed)
Left message for patient to call me back regarding eye exam due

## 2015-12-12 ENCOUNTER — Telehealth: Payer: Self-pay | Admitting: Rheumatology

## 2015-12-12 NOTE — Telephone Encounter (Signed)
One month supply was sent in last week, patient has not returned my calls about her Eye exam, I have advised her this is past due, she will call and check, have it faxed to us.

## 2015-12-12 NOTE — Telephone Encounter (Signed)
Patient is [facility] [place]

## 2015-12-19 ENCOUNTER — Other Ambulatory Visit: Payer: Self-pay | Admitting: *Deleted

## 2015-12-19 DIAGNOSIS — Z79899 Other long term (current) drug therapy: Secondary | ICD-10-CM

## 2015-12-19 LAB — COMPLETE METABOLIC PANEL WITH GFR
ALBUMIN: 4 g/dL (ref 3.6–5.1)
ALK PHOS: 62 U/L (ref 33–115)
ALT: 23 U/L (ref 6–29)
AST: 18 U/L (ref 10–35)
BILIRUBIN TOTAL: 0.2 mg/dL (ref 0.2–1.2)
BUN: 19 mg/dL (ref 7–25)
CO2: 27 mmol/L (ref 20–31)
CREATININE: 1.22 mg/dL — AB (ref 0.50–1.10)
Calcium: 9.8 mg/dL (ref 8.6–10.2)
Chloride: 104 mmol/L (ref 98–110)
GFR, EST AFRICAN AMERICAN: 60 mL/min (ref >=60)
GFR, EST NON AFRICAN AMERICAN: 52 mL/min — AB (ref >=60)
Glucose, Bld: 103 mg/dL — ABNORMAL HIGH (ref 65–99)
Potassium: 4.3 mmol/L (ref 3.5–5.3)
Sodium: 137 mmol/L (ref 135–146)
TOTAL PROTEIN: 7.4 g/dL (ref 6.1–8.1)

## 2015-12-19 LAB — CBC WITH DIFFERENTIAL/PLATELET
BASOS ABS: 0 {cells}/uL (ref 0–200)
BASOS PCT: 0 %
EOS ABS: 108 {cells}/uL (ref 15–500)
Eosinophils Relative: 3 %
HCT: 34.6 % — ABNORMAL LOW (ref 35.0–45.0)
HEMOGLOBIN: 10.5 g/dL — AB (ref 11.7–15.5)
LYMPHS ABS: 1008 {cells}/uL (ref 850–3900)
Lymphocytes Relative: 28 %
MCH: 23.1 pg — AB (ref 27.0–33.0)
MCHC: 30.3 g/dL — ABNORMAL LOW (ref 32.0–36.0)
MCV: 76.2 fL — ABNORMAL LOW (ref 80.0–100.0)
MONOS PCT: 12 %
MPV: 9.6 fL (ref 7.5–12.5)
Monocytes Absolute: 432 cells/uL (ref 200–950)
NEUTROS ABS: 2052 {cells}/uL (ref 1500–7800)
Neutrophils Relative %: 57 %
PLATELETS: 209 10*3/uL (ref 140–400)
RBC: 4.54 MIL/uL (ref 3.80–5.10)
RDW: 15.3 % — ABNORMAL HIGH (ref 11.0–15.0)
WBC: 3.6 10*3/uL — ABNORMAL LOW (ref 3.8–10.8)

## 2015-12-20 DIAGNOSIS — Z79899 Other long term (current) drug therapy: Secondary | ICD-10-CM | POA: Insufficient documentation

## 2015-12-20 DIAGNOSIS — M3219 Other organ or system involvement in systemic lupus erythematosus: Secondary | ICD-10-CM | POA: Insufficient documentation

## 2015-12-20 DIAGNOSIS — M19049 Primary osteoarthritis, unspecified hand: Secondary | ICD-10-CM | POA: Insufficient documentation

## 2015-12-20 DIAGNOSIS — M19079 Primary osteoarthritis, unspecified ankle and foot: Secondary | ICD-10-CM | POA: Insufficient documentation

## 2015-12-20 DIAGNOSIS — M170 Bilateral primary osteoarthritis of knee: Secondary | ICD-10-CM | POA: Insufficient documentation

## 2015-12-20 NOTE — Progress Notes (Signed)
Office Visit Note  Patient: Stephanie Myers             Date of Birth: 1965/02/20           MRN: [id number]             PCP: Sid Falcon, MD Referring: Loyal Jacobson, MD Visit Date: 12/22/2015 Occupation: @GUAROCC @    Subjective:  Follow-up (patient is improving, with knee pain and hand pain )   History of Present Illness: Stephanie Myers is a 50 y.o. female  Last seen 07/07/2015 History of lupus with positive ANA. No oral ulcers. No nasal ulcers. No fatigue. Questionable flare with occasional hand pain. Questionable flare with occasional itchiness to the skin. No rash associated with itchiness. Resolves on its own in a day or so.  Patient takes Plaquenil 2 mg twice a day Monday through Friday. No problems. Patient's Plaquenil eye exam was normal August 2016 and has an appointment for repeat eye exam December 22 for Plaquenil toxicity eye exam.  Her autoimmune workup was 08/23/2015   Activities of Daily Living:  Patient reports morning stiffness for 15 minutes.   Patient Denies nocturnal pain.  Difficulty dressing/grooming: Denies Difficulty climbing stairs: Denies Difficulty getting out of chair: Denies Difficulty using hands for taps, buttons, cutlery, and/or writing: Reports   Review of Systems  Constitutional: Negative for fatigue.  HENT: Negative for mouth sores and mouth dryness.   Eyes: Negative for dryness.  Respiratory: Negative for shortness of breath.   Gastrointestinal: Negative for constipation and diarrhea.  Musculoskeletal: Negative for myalgias and myalgias.  Skin: Negative for sensitivity to sunlight.  Psychiatric/Behavioral: Negative for decreased concentration and sleep disturbance.    PMFS History:  Patient Active Problem List   Diagnosis Date Noted  . 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 High risk medication use 12/20/2015  . 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 Osteoarthritis of foot  12/20/2015  . Bilateral primary osteoarthritis of knee 12/20/2015    Past Medical History:  Diagnosis Date  . Anemia   . Diabetes mellitus without complication (HCC)   . Hyperlipidemia   . Hypertension   . Lupus   . Shingles     No family history on file. Past Surgical History:  Procedure Laterality Date  . ABDOMINAL HYSTERECTOMY    . CESAREAN SECTION     Social History   Social History Narrative  . No narrative on file     Objective: Vital Signs: BP 102/62   Pulse 80   Resp 16   Ht 5\' 4"  (1.626 m)   Wt 210 lb (95.3 kg)   BMI 36.05 kg/m    Physical Exam  Constitutional: She is oriented to person, place, and time. She appears well-developed and well-nourished.  HENT:  Head: Normocephalic and atraumatic.  Eyes: EOM are normal. Pupils are equal, round, and reactive to light.  Cardiovascular: Normal rate, regular rhythm and normal heart sounds.  Exam reveals no gallop and no friction rub.   No murmur heard. Pulmonary/Chest: Effort normal and breath sounds normal. She has no wheezes. She has no rales.  Abdominal: Soft. Bowel sounds are normal. She exhibits no distension. There is no tenderness. There is no guarding. No hernia.  Musculoskeletal: Normal range of motion. She exhibits no edema, tenderness or deformity.  Lymphadenopathy:    She has no cervical adenopathy.  Neurological: She is alert and oriented to person, place, and time. Coordination normal.  Skin: Skin is  warm and dry. Capillary refill takes less than 2 seconds. No rash noted.  Psychiatric: She has a normal mood and affect. Her behavior is normal.     Musculoskeletal Exam:    CDAI Exam: CDAI Homunculus Exam:   Joint Counts:  CDAI Tender Joint count: 0 CDAI Swollen Joint count: 0  Global Assessments:  Patient Global Assessment: 0 Provider Global Assessment: 0    Investigation: Findings:  Labs from October 12, 2013 shows CMP with GFR within normal limits except for decreased sodium at 133  and decreased potassium at 3.4.  CBC with diff shows anemia.  Patient is advised to followed with PCP.  Hemoglobin is 9.5, hematocrit is 30, hep panel is negative, CK is negative, ACE is negative, TSH is negative, IgG is increased at 2,710, IgA and IgM are normal, C3 shows slight elevation and C4 is normal.  ANA is positive with a titer of 1:1,280 in a speckled pattern.  M-spike is negative.      Imaging: No results found.  Speciality Comments: No specialty comments available.    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 An cervical lymphadenopathy.  Return to clinic in 4 months for follow-up. On that visit we'll do her CBC with differential CMP with GFR and urinalysis  Orders: No orders of the defined types were placed in this encounter.  Meds ordered this encounter  Medications  . hydroxychloroquine (PLAQUENIL) 200 MG tablet    Sig: TAKE 1 TABLET BY MOUTH TWICE DAILY MONDAY THROUGH FRIDAY    Dispense:  180 tablet    Refill:  1    Face-to-face time spent with  patient was 30 minutes. 50% of time was spent in counseling and coordination of care.  Follow-Up Instructions: Return in about 4 years (around 12/22/2019) for sle,plq,handpain,obesity.   Eliezer Lofts, PA-C I examined and evaluated the patient with Eliezer Lofts PA. The plan of care was discussed as noted above.  Bo Merino, MD

## 2015-12-20 NOTE — Progress Notes (Signed)
Labs are stable, no change in therapy

## 2015-12-22 ENCOUNTER — Ambulatory Visit (INDEPENDENT_AMBULATORY_CARE_PROVIDER_SITE_OTHER): Payer: BLUE CROSS/BLUE SHIELD | Admitting: Rheumatology

## 2015-12-22 ENCOUNTER — Encounter: Payer: Self-pay | Admitting: Rheumatology

## 2015-12-22 VITALS — BP 102/62 | HR 80 | Resp 16 | Ht 64.0 in | Wt 210.0 lb

## 2015-12-22 DIAGNOSIS — M19071 Primary osteoarthritis, right ankle and foot: Secondary | ICD-10-CM | POA: Diagnosis not present

## 2015-12-22 DIAGNOSIS — M19042 Primary osteoarthritis, left hand: Secondary | ICD-10-CM

## 2015-12-22 DIAGNOSIS — M170 Bilateral primary osteoarthritis of knee: Secondary | ICD-10-CM

## 2015-12-22 DIAGNOSIS — M19072 Primary osteoarthritis, left ankle and foot: Secondary | ICD-10-CM

## 2015-12-22 DIAGNOSIS — M19041 Primary osteoarthritis, right hand: Secondary | ICD-10-CM | POA: Diagnosis not present

## 2015-12-22 DIAGNOSIS — Z79899 Other long term (current) drug therapy: Secondary | ICD-10-CM | POA: Diagnosis not present

## 2015-12-22 DIAGNOSIS — M3219 Other organ or system involvement in systemic lupus erythematosus: Secondary | ICD-10-CM | POA: Diagnosis not present

## 2015-12-22 DIAGNOSIS — I10 Essential (primary) hypertension: Secondary | ICD-10-CM | POA: Diagnosis not present

## 2015-12-22 MED ORDER — HYDROXYCHLOROQUINE SULFATE 200 MG PO TABS: ORAL_TABLET | ORAL | 1 refills | Status: DC

## 2015-12-25 IMAGING — CT CT ANGIO CHEST
3 of 7 series · 19 of 36 positions shown · IV contrast (APPLIED)
Comparison: Chest CT July 21, 2013; chest radiograph January 13, 2014

CLINICAL DATA: Chest pain for 1 day

EXAM:
CT ANGIOGRAPHY CHEST WITH CONTRAST
TECHNIQUE: Multidetector CT imaging of the chest was performed using the
standard protocol during bolus administration of intravenous
contrast. Multiplanar CT image reconstructions and MIPs were
obtained to evaluate the vascular anatomy.
CONTRAST:  100mL OMNIPAQUE IOHEXOL 350 MG/ML SOLN

[Series 8: pe 1.0 b26f · axial · 0.57mm/px · z∈[+1414,+1596]mm · 15 of 209 slices shown]
[im 14/209  lung]
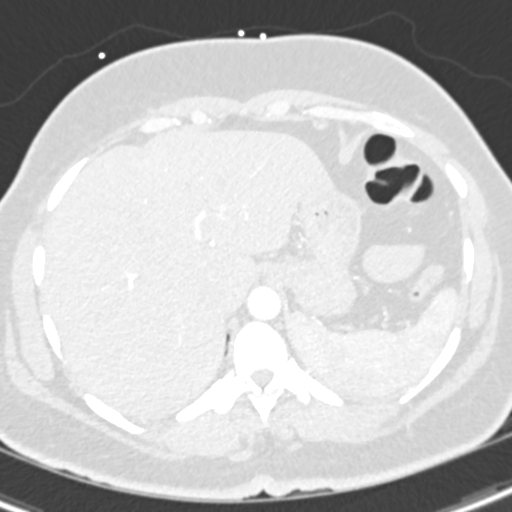
[im 27/209  mediastinal]
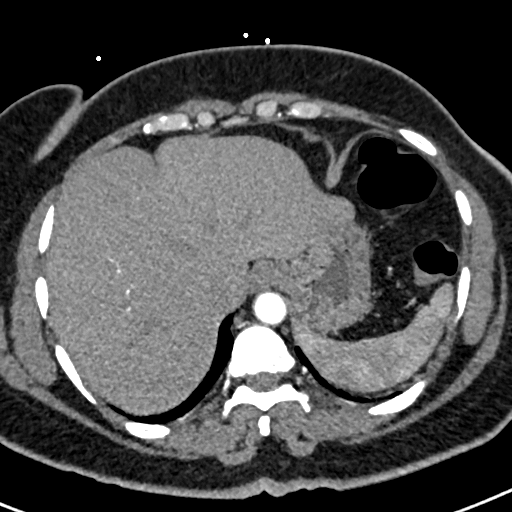
[im 40/209  lung]
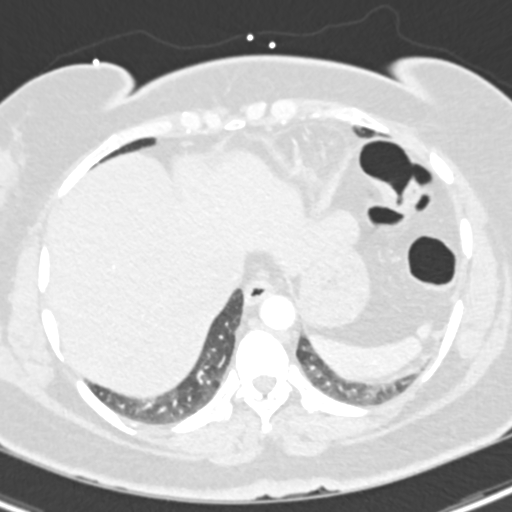
[im 53/209  mediastinal]
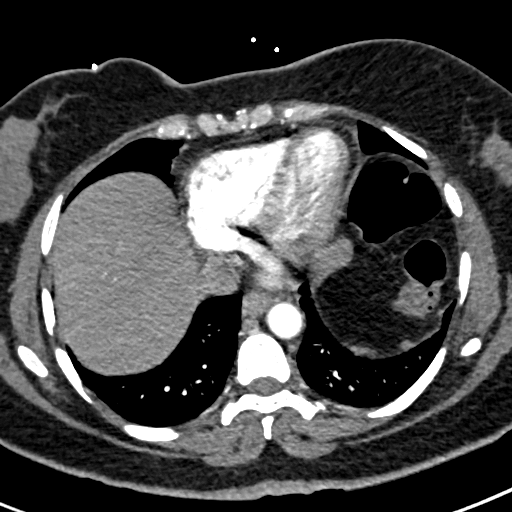
[im 66/209  lung]
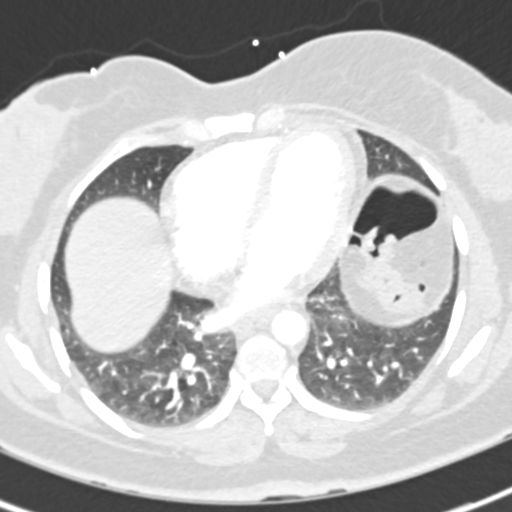
[im 79/209  mediastinal]
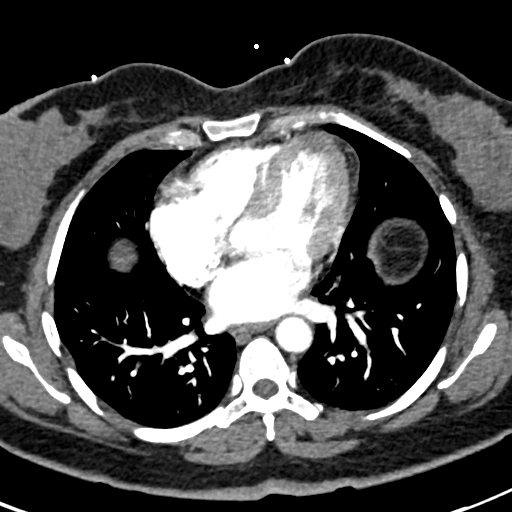
[im 92/209  lung]
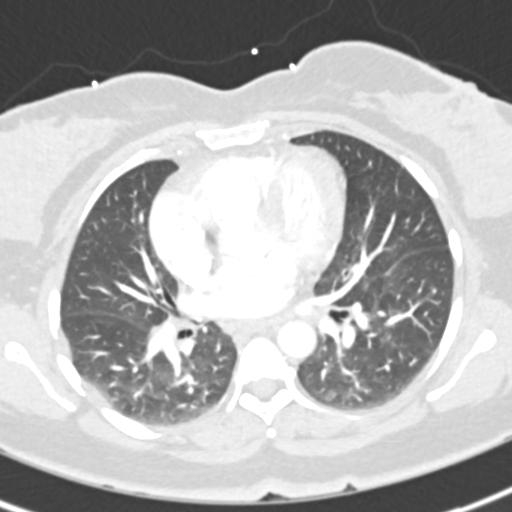
[im 105/209  mediastinal]
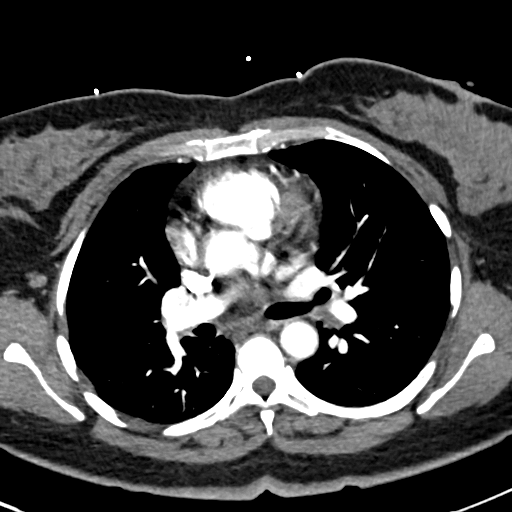
[im 118/209  lung]
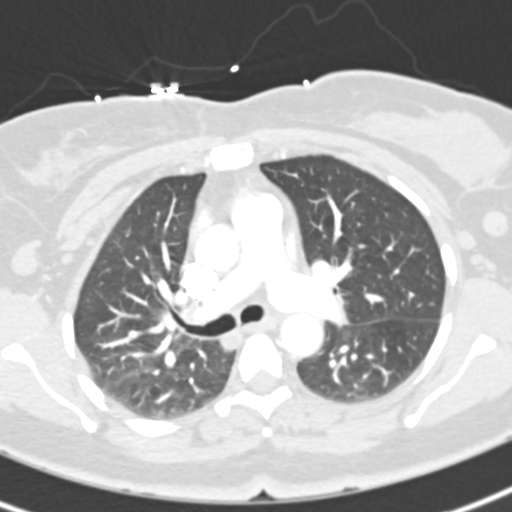
[im 131/209  mediastinal]
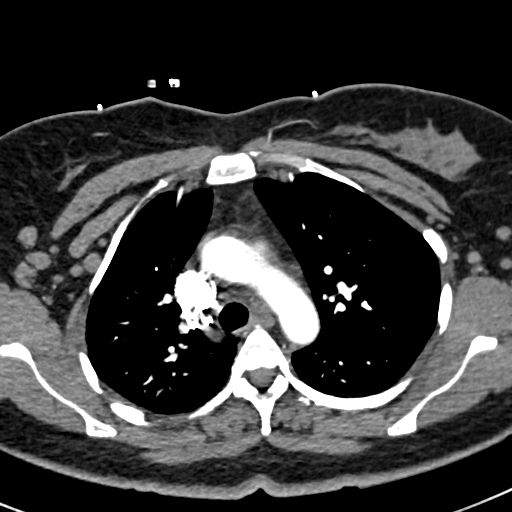
[im 144/209  lung]
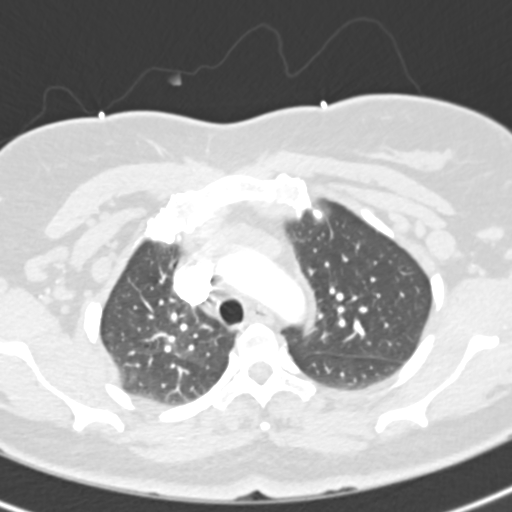
[im 157/209  mediastinal]
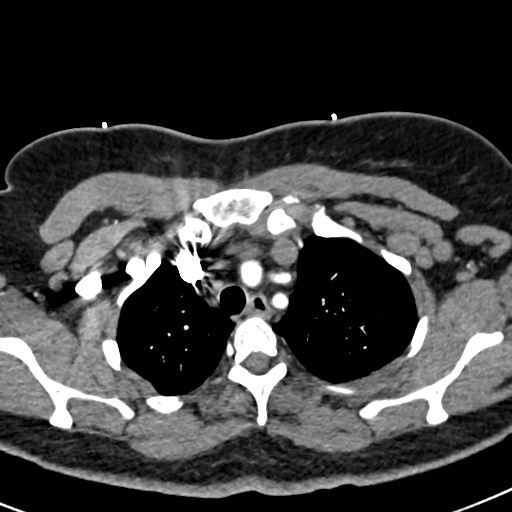
[im 170/209  lung]
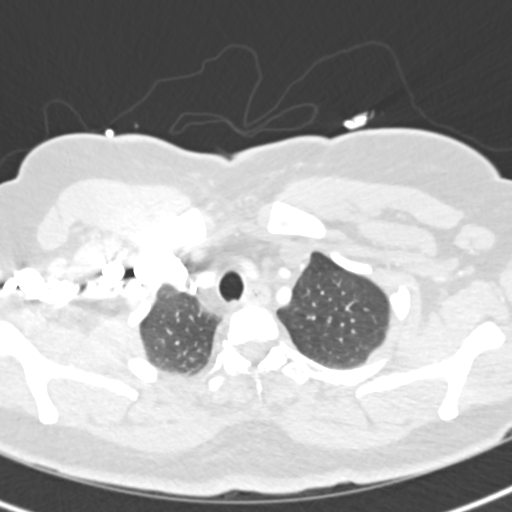
[im 183/209  mediastinal]
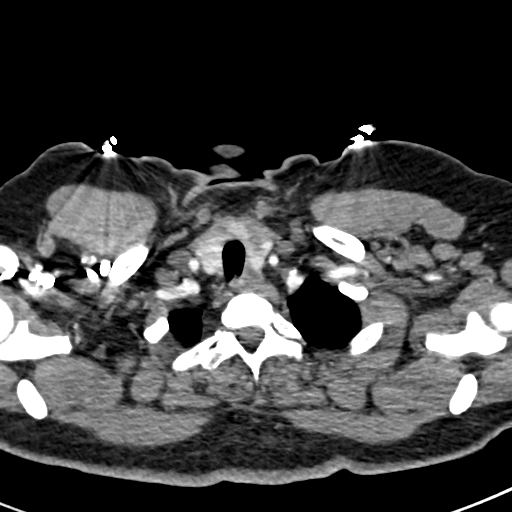
[im 196/209  lung]
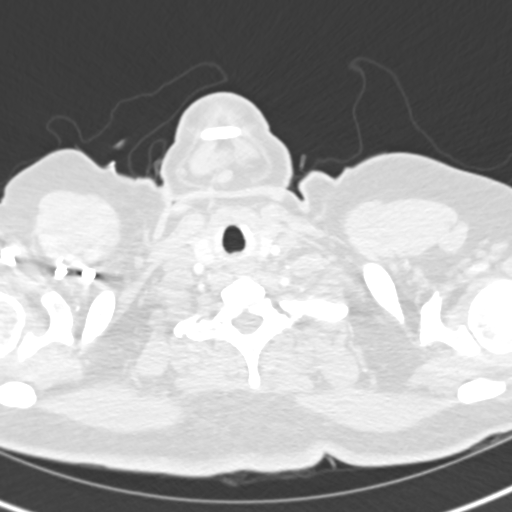

[Series 9: pe 3.0 lung · axial · 0.57mm/px · z∈[+1444,+1526]mm · 3 of 69 slices shown]
[im 14/69  mediastinal]
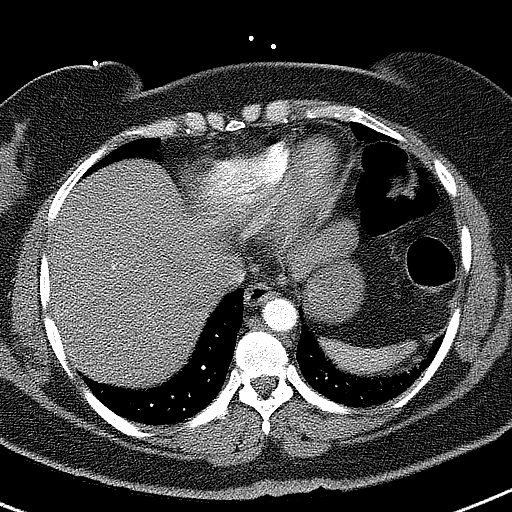
[im 28/69  mediastinal]
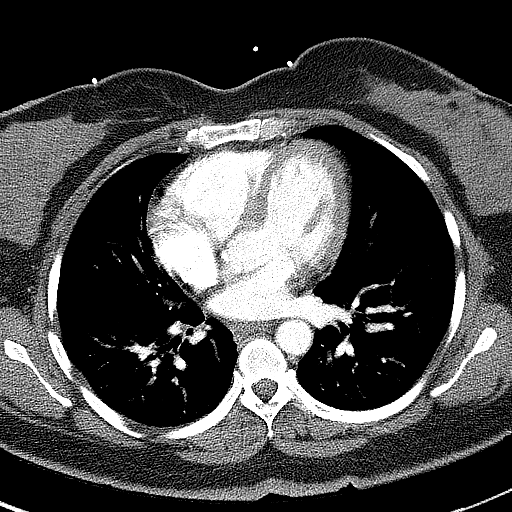
[im 41/69  mediastinal]
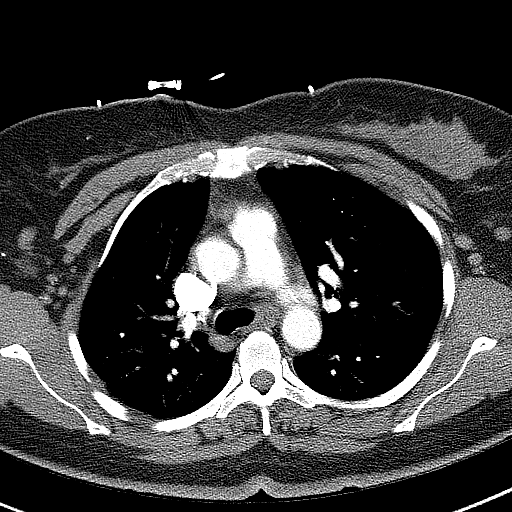

[Series 11: pe 2.0 coronal · coronal · 0.48mm/px · 1 of 134 slices shown]
[im 67/134  mediastinal]
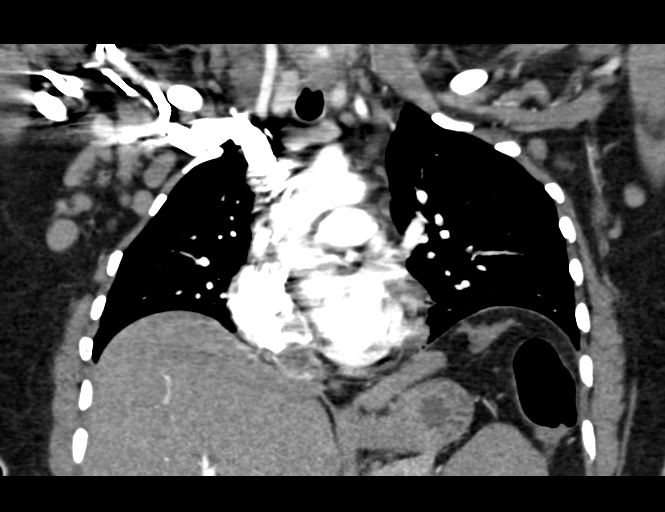

[19 of 36 positions shown; findings below may reference images not displayed]

FINDINGS: There is no demonstrable pulmonary embolus. There is no thoracic
aortic aneurysm or dissection.

Lungs are clear.

There are multiple axillary lymph nodes bilaterally, ranging in size
from as small as 4 mm to as large as 1.7 x 1.4 cm. No other lymph
node prominence is appreciable.

Pericardium is not thickened.

In the visualized upper abdomen, there is SPECT steatosis.

There is a focal area of decreased attenuation in the left lobe of
the thyroid, stable.

There are no blastic or lytic bone lesions.

Review of the MIP images confirms the above findings.
IMPRESSION: No demonstrable pulmonary embolus.  Lungs clear.

Stable axillary lymph node prominence bilaterally. Etiology for this
lymph node prominence is uncertain. This finding warrants clinical
and imaging surveillance. If there is a history of prior neoplasm,
nuclear medicine PET study may be reasonable to assess for abnormal
metabolic activity.

Area of decreased attenuation in the left lobe of the thyroid.
Nonemergent thyroid ultrasound may be advisable to further assess.

## 2016-01-03 ENCOUNTER — Telehealth: Payer: Self-pay | Admitting: Rheumatology

## 2016-01-03 NOTE — Telephone Encounter (Signed)
Patient states she may have to reschedule her PLQ Eye Exam. Patient states it would be for the first of January. Patient advised we would need the exam in order to refill her medications. Patient just received a refill on December 22, 2015. Patient verbalized understanding and will make sure to have the results sent to us.

## 2016-01-03 NOTE — Telephone Encounter (Signed)
Patient has questions about plaquenil eye exam. She has a scheduled appointment for 01/06/16 but she will need to reschedule that appointment with the eye doctor. She was told it would be after the first of the year before she could get back in. Patient would like to know if this is ok with Dr. Corliss Skainseveshwar? Please call patient.

## 2016-01-18 ENCOUNTER — Telehealth: Payer: Self-pay | Admitting: Rheumatology

## 2016-01-18 DIAGNOSIS — Z79899 Other long term (current) drug therapy: Secondary | ICD-10-CM

## 2016-01-18 NOTE — Telephone Encounter (Signed)
Received Documentation.

## 2016-01-18 NOTE — Telephone Encounter (Signed)
Patient states she had her eye doctor fax over the plaquenil eye exam form this morning.

## 2016-04-17 DIAGNOSIS — I10 Essential (primary) hypertension: Secondary | ICD-10-CM | POA: Insufficient documentation

## 2016-04-17 NOTE — Progress Notes (Deleted)
Office Visit Note  Patient: Stephanie Myers             Date of Birth: January 26, 1965           MRN: [id number]             PCP: Wonda Cerise, MD Referring: Jefm Petty, MD Visit Date: 04/19/2016 Occupation: '@GUAROCC'$ @    Subjective:  No chief complaint on file.   History of Present Illness: Stephanie Myers is a 51 y.o. female ***   Activities of Daily Living:  Patient reports morning stiffness for *** {minute/hour:19697}.   Patient {ACTIONS;DENIES/REPORTS:21021675::"Denies"} nocturnal pain.  Difficulty dressing/grooming: {ACTIONS;DENIES/REPORTS:21021675::"Denies"} Difficulty climbing stairs: {ACTIONS;DENIES/REPORTS:21021675::"Denies"} Difficulty getting out of chair: {ACTIONS;DENIES/REPORTS:21021675::"Denies"} Difficulty using hands for taps, buttons, cutlery, and/or writing: {ACTIONS;DENIES/REPORTS:21021675::"Denies"}   No Rheumatology ROS completed.   PMFS History:  Patient Active Problem List   Diagnosis Date Noted  . Other organ or system involvement in systemic lupus erythematosus ([place]) 12/20/2015  . High risk medication use 12/20/2015  . 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 Osteoarthritis of foot 12/20/2015  . Bilateral primary osteoarthritis of knee 12/20/2015    Past Medical History:  Diagnosis Date  . Anemia   . Diabetes mellitus without complication ([place])   . Hyperlipidemia   . Hypertension   . Lupus   . Shingles     No family history on file. Past Surgical History:  Procedure Laterality Date  . ABDOMINAL HYSTERECTOMY    . CESAREAN SECTION     Social History   Social History Narrative  . No narrative on file     Objective: Vital Signs: There were no vitals taken for this visit.   Physical Exam   Musculoskeletal Exam: ***  CDAI Exam: No CDAI exam completed.    Investigation: Findings:  Labs from October 12, 2013 shows CMP with GFR within normal limits except for decreased sodium at 133 and decreased potassium at 3.4.  CBC with diff  shows anemia.  Patient is advised to followed with PCP.  Hemoglobin is 9.5, hematocrit is 30, hep panel is negative, CK is negative, ACE is negative, TSH is negative, IgG is increased at 2,710, IgA and IgM are normal, C3 shows slight elevation and C4 is normal.  ANA is positive with a titer of 1:1,280 in a speckled pattern.  M-spike is negative.  Patient will get her Plaquenil eye exam done on December 2017   [place] Normal-01/2016  Lab on 12/19/2015  Component Date Value Ref Range Status  . WBC 12/19/2015 3.6* 3.8 - 10.8 K/uL Final  . RBC 12/19/2015 4.54  3.80 - 5.10 MIL/uL Final  . Hemoglobin 12/19/2015 10.5* 11.7 - 15.5 g/dL Final  . HCT 12/19/2015 34.6* 35.0 - 45.0 % Final  . MCV 12/19/2015 76.2* 80.0 - 100.0 fL Final  . MCH 12/19/2015 23.1* 27.0 - 33.0 pg Final  . MCHC 12/19/2015 30.3* 32.0 - 36.0 g/dL Final  . RDW 12/19/2015 15.3* 11.0 - 15.0 % Final  . Platelets 12/19/2015 209  140 - 400 K/uL Final  . MPV 12/19/2015 9.6  7.5 - 12.5 fL Final  . Neutro Abs 12/19/2015 2052  1,500 - 7,800 cells/uL Final  . Lymphs Abs 12/19/2015 1008  850 - 3,900 cells/uL Final  . Monocytes Absolute 12/19/2015 432  200 - 950 cells/uL Final  . Eosinophils Absolute 12/19/2015 108  15 - 500 cells/uL Final  . Basophils Absolute 12/19/2015 0  0 - 200 cells/uL Final  . Neutrophils Relative % 12/19/2015 57  %  Final  . Lymphocytes Relative 12/19/2015 28  % Final  . Monocytes Relative 12/19/2015 12  % Final  . Eosinophils Relative 12/19/2015 3  % Final  . Basophils Relative 12/19/2015 0  % Final  . Smear Review 12/19/2015 Criteria for review not met   Final  . Sodium 12/19/2015 137  135 - 146 mmol/L Final  . Potassium 12/19/2015 4.3  3.5 - 5.3 mmol/L Final  . Chloride 12/19/2015 104  98 - 110 mmol/L Final  . CO2 12/19/2015 27  20 - 31 mmol/L Final  . Glucose, Bld 12/19/2015 103* 65 - 99 mg/dL Final  . BUN 12/19/2015 19  7 - 25 mg/dL Final  . Creat 12/19/2015 1.22* 0.50 - 1.10 mg/dL Final  .  Total Bilirubin 12/19/2015 0.2  0.2 - 1.2 mg/dL Final  . Alkaline Phosphatase 12/19/2015 62  33 - 115 U/L Final  . AST 12/19/2015 18  10 - 35 U/L Final  . ALT 12/19/2015 23  6 - 29 U/L Final  . Total Protein 12/19/2015 7.4  6.1 - 8.1 g/dL Final  . Albumin 12/19/2015 4.0  3.6 - 5.1 g/dL Final  . Calcium 12/19/2015 9.8  8.6 - 10.2 mg/dL Final  . GFR, Est African American 12/19/2015 60  >=60 mL/min Final  . GFR, Est Non African American 12/19/2015 52* >=60 mL/min Final      Imaging: No results found.  Speciality Comments: No specialty comments available.    Procedures:  No procedures performed Allergies: Lisinopril   Assessment / Plan:     Visit Diagnoses: No diagnosis found.    Orders: No orders of the defined types were placed in this encounter.  No orders of the defined types were placed in this encounter.   Face-to-face time spent with patient was *** minutes. 50% of time was spent in counseling and coordination of care.  Follow-Up Instructions: No Follow-up on file.   Rhealyn Cullen, [place]  Note - This record has been created using [employer].  Chart creation errors have been sought, but may not always  have been located. Such creation errors do not reflect on  the standard of medical care.

## 2016-04-19 ENCOUNTER — Ambulatory Visit: Payer: BLUE CROSS/BLUE SHIELD | Admitting: Rheumatology

## 2016-04-25 ENCOUNTER — Other Ambulatory Visit: Payer: Self-pay | Admitting: *Deleted

## 2016-04-25 DIAGNOSIS — Z79899 Other long term (current) drug therapy: Secondary | ICD-10-CM

## 2016-04-25 LAB — COMPLETE METABOLIC PANEL WITH GFR
ALT: 23 U/L (ref 6–29)
AST: 19 U/L (ref 10–35)
Albumin: 4.3 g/dL (ref 3.6–5.1)
Alkaline Phosphatase: 56 U/L (ref 33–115)
BUN: 17 mg/dL (ref 7–25)
CHLORIDE: 104 mmol/L (ref 98–110)
CO2: 25 mmol/L (ref 20–31)
CREATININE: 1.34 mg/dL — AB (ref 0.50–1.10)
Calcium: 9.9 mg/dL (ref 8.6–10.2)
GFR, EST AFRICAN AMERICAN: 54 mL/min — AB (ref >=60)
GFR, Est Non African American: 47 mL/min — ABNORMAL LOW (ref >=60)
Glucose, Bld: 84 mg/dL (ref 65–99)
POTASSIUM: 4.7 mmol/L (ref 3.5–5.3)
Sodium: 137 mmol/L (ref 135–146)
Total Bilirubin: 0.3 mg/dL (ref 0.2–1.2)
Total Protein: 7.4 g/dL (ref 6.1–8.1)

## 2016-04-25 LAB — CBC WITH DIFFERENTIAL/PLATELET
Basophils Absolute: 0 cells/uL (ref 0–200)
Basophils Relative: 0 %
EOS ABS: 108 {cells}/uL (ref 15–500)
Eosinophils Relative: 3 %
HEMATOCRIT: 35.6 % (ref 35.0–45.0)
Hemoglobin: 10.6 g/dL — ABNORMAL LOW (ref 11.7–15.5)
LYMPHS PCT: 19 %
Lymphs Abs: 684 cells/uL — ABNORMAL LOW (ref 850–3900)
MCH: 22.7 pg — ABNORMAL LOW (ref 27.0–33.0)
MCHC: 29.8 g/dL — AB (ref 32.0–36.0)
MCV: 76.2 fL — AB (ref 80.0–100.0)
MONO ABS: 324 {cells}/uL (ref 200–950)
MPV: 10.7 fL (ref 7.5–12.5)
Monocytes Relative: 9 %
NEUTROS PCT: 69 %
Neutro Abs: 2484 cells/uL (ref 1500–7800)
Platelets: 239 10*3/uL (ref 140–400)
RBC: 4.67 MIL/uL (ref 3.80–5.10)
RDW: 16.4 % — AB (ref 11.0–15.0)
WBC: 3.6 10*3/uL — ABNORMAL LOW (ref 3.8–10.8)

## 2016-04-26 LAB — URINALYSIS, ROUTINE W REFLEX MICROSCOPIC
Bilirubin Urine: NEGATIVE
Glucose, UA: NEGATIVE
HGB URINE DIPSTICK: NEGATIVE
KETONES UR: NEGATIVE
NITRITE: NEGATIVE
PH: 5.5 (ref 5.0–8.0)
Protein, ur: NEGATIVE
Specific Gravity, Urine: 1.01 (ref 1.001–1.035)

## 2016-04-26 LAB — URINALYSIS, MICROSCOPIC ONLY
Bacteria, UA: NONE SEEN [HPF]
CASTS: NONE SEEN [LPF]
Crystals: NONE SEEN [HPF]
RBC / HPF: NONE SEEN RBC/HPF (ref <=2)
Yeast: NONE SEEN [HPF]

## 2016-05-04 NOTE — Progress Notes (Signed)
Office Visit Note  Patient: Stephanie Myers             Date of Birth: Oct 07, 1965           MRN: [id number]             PCP: Wonda Cerise, MD Referring: Jefm Petty, MD Visit Date: 05/07/2016 Occupation: _0 @    Subjective:  No chief complaint on file.   History of Present Illness: Stephanie Myers is a 52 y.o. female   last seen 12/22/2015 On that visit in December, patient was doing wellwith her lupus. Had no oral ulcers or nasal ulcers or fatigue. Was taking Plaquenil 2 times a day Monday through Friday without any problems.  The only concern we had was her Plaquenil edated. She had one done in August 2016 and schedule for repeat December 2018.   PLQ EYE EXAM NEG Jan 17, 2016.  Activities of Daily Living:  Patient reports morning stiffness for 15 minutes.   Patient Denies nocturnal pain.  Difficulty dressing/grooming: Denies Difficulty climbing stairs: Denies Difficulty getting out of chair: Denies Difficulty using hands for taps, buttons, cutlery, and/or writing: Denies   Review of Systems  Constitutional: Negative for fatigue.  HENT: Negative for mouth sores and mouth dryness.   Eyes: Negative for dryness.  Respiratory: Negative for shortness of breath.   Gastrointestinal: Negative for constipation and diarrhea.  Musculoskeletal: Negative for myalgias and myalgias.  Skin: Negative for sensitivity to sunlight.  Psychiatric/Behavioral: Negative for decreased concentration and sleep disturbance.    PMFS History:  Patient Active Problem List   Diagnosis Date Noted  . Essential hypertension 04/17/2016  . Other organ or system involvement in systemic lupus erythematosus ([place]) 12/20/2015  . High risk medication use 12/20/2015  . 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 Osteoarthritis of foot 12/20/2015  . Bilateral primary osteoarthritis of knee 12/20/2015    Past Medical History:  Diagnosis Date  . Anemia   . Diabetes mellitus without complication ([place])    . Hyperlipidemia   . Hypertension   . Lupus   . Shingles     No family history on file. Past Surgical History:  Procedure Laterality Date  . ABDOMINAL HYSTERECTOMY    . CESAREAN SECTION     Social History   Social History Narrative  . No narrative on file     Objective: Vital Signs: There were no vitals taken for this visit.   Physical Exam  Constitutional: She is oriented to person, place, and time. She appears well-developed and well-nourished.  HENT:  Head: Normocephalic and atraumatic.  Eyes: EOM are normal. Pupils are equal, round, and reactive to light.  Cardiovascular: Normal rate, regular rhythm and normal heart sounds.  Exam reveals no gallop and no friction rub.   No murmur heard. Pulmonary/Chest: Effort normal and breath sounds normal. She has no wheezes. She has no rales.  Abdominal: Soft. Bowel sounds are normal. She exhibits no distension. There is no tenderness. There is no guarding. No hernia.  Musculoskeletal: Normal range of motion. She exhibits no edema, tenderness or deformity.  Lymphadenopathy:    She has no cervical adenopathy.  Neurological: She is alert and oriented to person, place, and time. Coordination normal.  Skin: Skin is warm and dry. Capillary refill takes less than 2 seconds. No rash noted.  Psychiatric: She has a normal mood and affect. Her behavior is normal.  Nursing note and vitals reviewed.    Musculoskeletal Exam:  Full range of motion of  all joints Grip strength is equal and strong bilaterally For myalgia tender points are all absent  CDAI Exam: CDAI Homunculus Exam:   Joint Counts:  CDAI Tender Joint count: 0 CDAI Swollen Joint count: 0     Investigation: No additional findings. CBC    Component Value Date/Time   WBC 3.6 (L) 04/25/2016 1115   RBC 4.67 04/25/2016 1115   HGB 10.6 (L) 04/25/2016 1115   HCT 35.6 04/25/2016 1115   PLT 239 04/25/2016 1115   MCV 76.2 (L) 04/25/2016 1115   MCH 22.7 (L) 04/25/2016  1115   MCHC 29.8 (L) 04/25/2016 1115   RDW 16.4 (H) 04/25/2016 1115   LYMPHSABS 684 (L) 04/25/2016 1115   MONOABS 324 04/25/2016 1115   EOSABS 108 04/25/2016 1115   BASOSABS 0 04/25/2016 1115   CMP     Component Value Date/Time   NA 137 04/25/2016 1115   K 4.7 04/25/2016 1115   CL 104 04/25/2016 1115   CO2 25 04/25/2016 1115   GLUCOSE 84 04/25/2016 1115   BUN 17 04/25/2016 1115   CREATININE 1.34 (H) 04/25/2016 1115   CALCIUM 9.9 04/25/2016 1115   PROT 7.4 04/25/2016 1115   ALBUMIN 4.3 04/25/2016 1115   AST 19 04/25/2016 1115   ALT 23 04/25/2016 1115   ALKPHOS 56 04/25/2016 1115   BILITOT 0.3 04/25/2016 1115   GFRNONAA 47 (L) 04/25/2016 1115   GFRAA 54 (L) 04/25/2016 1115   Orders Only on 04/25/2016  Component Date Value Ref Range Status  . WBC 04/25/2016 3.6* 3.8 - 10.8 K/uL Final  . RBC 04/25/2016 4.67  3.80 - 5.10 MIL/uL Final  . Hemoglobin 04/25/2016 10.6* 11.7 - 15.5 g/dL Final  . HCT 04/25/2016 35.6  35.0 - 45.0 % Final  . MCV 04/25/2016 76.2* 80.0 - 100.0 fL Final  . MCH 04/25/2016 22.7* 27.0 - 33.0 pg Final  . MCHC 04/25/2016 29.8* 32.0 - 36.0 g/dL Final  . RDW 04/25/2016 16.4* 11.0 - 15.0 % Final  . Platelets 04/25/2016 239  140 - 400 K/uL Final  . MPV 04/25/2016 10.7  7.5 - 12.5 fL Final  . Neutro Abs 04/25/2016 2484  1,500 - 7,800 cells/uL Final  . Lymphs Abs 04/25/2016 684* 850 - 3,900 cells/uL Final  . Monocytes Absolute 04/25/2016 324  200 - 950 cells/uL Final  . Eosinophils Absolute 04/25/2016 108  15 - 500 cells/uL Final  . Basophils Absolute 04/25/2016 0  0 - 200 cells/uL Final  . Neutrophils Relative % 04/25/2016 69  % Final  . Lymphocytes Relative 04/25/2016 19  % Final  . Monocytes Relative 04/25/2016 9  % Final  . Eosinophils Relative 04/25/2016 3  % Final  . Basophils Relative 04/25/2016 0  % Final  . Smear Review 04/25/2016 Criteria for review not met   Final  . Sodium 04/25/2016 137  135 - 146 mmol/L Final  . Potassium 04/25/2016 4.7  3.5 -  5.3 mmol/L Final  . Chloride 04/25/2016 104  98 - 110 mmol/L Final  . CO2 04/25/2016 25  20 - 31 mmol/L Final  . Glucose, Bld 04/25/2016 84  65 - 99 mg/dL Final  . BUN 04/25/2016 17  7 - 25 mg/dL Final  . Creat 04/25/2016 1.34* 0.50 - 1.10 mg/dL Final  . Total Bilirubin 04/25/2016 0.3  0.2 - 1.2 mg/dL Final  . Alkaline Phosphatase 04/25/2016 56  33 - 115 U/L Final  . AST 04/25/2016 19  10 - 35 U/L Final  . ALT 04/25/2016 23  6 - 29 U/L Final  . Total Protein 04/25/2016 7.4  6.1 - 8.1 g/dL Final  . Albumin 04/25/2016 4.3  3.6 - 5.1 g/dL Final  . Calcium 04/25/2016 9.9  8.6 - 10.2 mg/dL Final  . GFR, Est African American 04/25/2016 54* >=60 mL/min Final  . GFR, Est Non African American 04/25/2016 47* >=60 mL/min Final  . Color, Urine 04/25/2016 YELLOW  YELLOW Final  . APPearance 04/25/2016 CLEAR  CLEAR Final  . Specific Gravity, Urine 04/25/2016 1.010  1.001 - 1.035 Final  . pH 04/25/2016 5.5  5.0 - 8.0 Final  . Glucose, UA 04/25/2016 NEGATIVE  NEGATIVE Final  . Bilirubin Urine 04/25/2016 NEGATIVE  NEGATIVE Final  . Ketones, ur 04/25/2016 NEGATIVE  NEGATIVE Final  . Hgb urine dipstick 04/25/2016 NEGATIVE  NEGATIVE Final  . Protein, ur 04/25/2016 NEGATIVE  NEGATIVE Final  . Nitrite 04/25/2016 NEGATIVE  NEGATIVE Final  . Leukocytes, UA 04/25/2016 1+* NEGATIVE Final  . WBC, UA 04/25/2016 0-5  <=5 WBC/HPF Final  . RBC / HPF 04/25/2016 NONE SEEN  <=2 RBC/HPF Final  . Squamous Epithelial / LPF 04/25/2016 6-10* <=5 HPF Final  . Bacteria, UA 04/25/2016 NONE SEEN  NONE SEEN HPF Final  . Crystals 04/25/2016 NONE SEEN  NONE SEEN HPF Final  . Casts 04/25/2016 NONE SEEN  NONE SEEN LPF Final  . Yeast 04/25/2016 NONE SEEN  NONE SEEN HPF Final     Imaging: No results found.  Speciality Comments: No specialty comments available.   Procedures:  No procedures performed Allergies: Lisinopril   Assessment / Plan:     Visit Diagnoses: Other organ or system involvement in systemic lupus  erythematosus ([place]) - +ANA ;   High risk medication use - PLQ 200 mg BID Mon- Fri, repeat eye exam planned 01/05/17.  Primary osteoarthritis of both hands  Primary osteoarthritis of both feet  Bilateral primary osteoarthritis of knee   PLQ EYE EXAM NEG Jan 17, 2016. (DOCUMENT IN CHART UNDER MEDIA TAB)   RIGHT LATERAL EPICONDYLITIS. aDVISED PATIENT TO GET A BRACE FROM OVER-THE-COUNTER. pATIENT IS AGREEABLE.  Prescription Voltaren gel.  Return to clinic in 4 months  CBC with differential, CMP with GFR, urinalysis do in 4 months  Handout for right tennis elbow  Refill Plaquenil 200 mg twice a day Monday through Friday ninety-day supply with a refill  Note: Patient is advised to use sunscreen regularly.     Orders: No orders of the defined types were placed in this encounter.  No orders of the defined types were placed in this encounter.   Face-to-face time spent with patient was 30 minutes. 50% of time was spent in counseling and coordination of care.  Follow-Up Instructions: No Follow-up on file.   Eliezer Lofts, PA-C  Note - This record has been created using [employer].  Chart creation errors have been sought, but may not always  have been located. Such creation errors do not reflect on  the standard of medical care.

## 2016-05-07 ENCOUNTER — Encounter: Payer: Self-pay | Admitting: Rheumatology

## 2016-05-07 ENCOUNTER — Ambulatory Visit (INDEPENDENT_AMBULATORY_CARE_PROVIDER_SITE_OTHER): Payer: BLUE CROSS/BLUE SHIELD | Admitting: Rheumatology

## 2016-05-07 VITALS — BP 100/65 | HR 86 | Resp 14 | Ht 65.0 in | Wt 207.0 lb

## 2016-05-07 DIAGNOSIS — M25521 Pain in right elbow: Secondary | ICD-10-CM | POA: Diagnosis not present

## 2016-05-07 DIAGNOSIS — M3219 Other organ or system involvement in systemic lupus erythematosus: Secondary | ICD-10-CM

## 2016-05-07 DIAGNOSIS — M19072 Primary osteoarthritis, left ankle and foot: Secondary | ICD-10-CM | POA: Diagnosis not present

## 2016-05-07 DIAGNOSIS — M19071 Primary osteoarthritis, right ankle and foot: Secondary | ICD-10-CM

## 2016-05-07 DIAGNOSIS — M19041 Primary osteoarthritis, right hand: Secondary | ICD-10-CM

## 2016-05-07 DIAGNOSIS — M19042 Primary osteoarthritis, left hand: Secondary | ICD-10-CM | POA: Diagnosis not present

## 2016-05-07 DIAGNOSIS — Z79899 Other long term (current) drug therapy: Secondary | ICD-10-CM

## 2016-05-07 DIAGNOSIS — M170 Bilateral primary osteoarthritis of knee: Secondary | ICD-10-CM | POA: Diagnosis not present

## 2016-05-07 MED ORDER — DICLOFENAC SODIUM 1 % TD GEL: 4.0000 g | Freq: Four times a day (QID) | TRANSDERMAL | 3 refills | Status: AC

## 2016-05-07 MED ORDER — HYDROXYCHLOROQUINE SULFATE 200 MG PO TABS: ORAL_TABLET | ORAL | 1 refills | Status: DC

## 2016-05-07 NOTE — Patient Instructions (Signed)
   Tennis Elbow Tennis elbow is puffiness (inflammation) of the outer tendons of your forearm close to your elbow. Your tendons attach your muscles to your bones. Tennis elbow can happen in any sport or job in which you use your elbow too much. It is caused by doing the same motion over and over. Tennis elbow can cause:  Pain and tenderness in your forearm and the outer part of your elbow.  A burning feeling. This runs from your elbow through your arm.  Weak grip in your hands. Follow these instructions at home: Activity   Rest your elbow and wrist as told by your doctor. Try to avoid any activities that caused the problem until your doctor says that you can do them again.  If a physical therapist teaches you ex0ercises, do all of them as told.  If you lift an object, lift it with your palm facing up. This is easier on your elbow. Lifestyle   If your tennis elbow is caused by sports, check your equipment and make sure that:  You are using it correctly.  It fits you well.  If your tennis elbow is caused by work, take breaks often, if you are able. Talk with your manager about doing your work in a way that is safe for you.  If your tennis elbow is caused by computer use, talk with your manager about any changes that can be made to your work setup. General instructions   If told, apply ice to the painful area:  Put ice in a plastic bag.  Place a towel between your skin and the bag.  Leave the ice on for 20 minutes, 2-3 times per day.  Take medicines only as told by your doctor.  If you were given a brace, wear it as told by your doctor.  Keep all follow-up visits as told by your doctor. This is important. Contact a doctor if:  Your pain does not get better with treatment.  Your pain gets worse.  You have weakness in your forearm, hand, or fingers.  You cannot feel your forearm, hand, or fingers. This information is not intended to replace advice given to you by your  health care provider. Make sure you discuss any questions you have with your health care provider. Document Released: 06/21/2009 Document Revised: 09/01/2015 Document Reviewed: 12/28/2013 Elsevier Interactive Patient Education  2017 [employer].

## 2016-05-08 ENCOUNTER — Telehealth: Payer: Self-pay

## 2016-05-08 NOTE — Telephone Encounter (Signed)
Received a prior authorization for Voltaren Gel from [facility]. The claim was submitted though cover my meds.   Received a fax from [facility] regarding a prior authorization DENIAL for Voltaren Gel.  Phone number:[phone]  Will send documents to scan center  Spoke with patient to update her. Informed her of the GoodRx coupon to help with the cost. Patient voices understanding and denies any questions at this time.   Jakyrah Holladay, [place], CPhT  2:42 PM

## 2016-06-25 ENCOUNTER — Other Ambulatory Visit: Payer: Self-pay | Admitting: Rheumatology

## 2016-06-25 NOTE — Telephone Encounter (Signed)
Last Visit:05/07/16 Next Visit: 09/06/16 Labs: 04/25/16 CBC shows anemia, CMP- creat. 1.34 GFR- 54  PLQ Eye Exam: 01/17/16 WNL  Okay to refill PLQ?

## 2016-07-12 ENCOUNTER — Telehealth: Payer: Self-pay | Admitting: Rheumatology

## 2016-07-12 NOTE — Telephone Encounter (Signed)
Patient states she is having pain in her bilateral forearms. Patient states picking up items or moving it certain direction makes it hurt worse. Patient states it is an aching pain. Patient states it comes it comes and goes. Patient states this is new for her. Patient does have a prescription for Voltaren Gel she will pick up from the pharmacy and would like to know if this may help with this problem or if there is something else she should be concerned with or should use. Please advise.

## 2016-07-12 NOTE — Telephone Encounter (Signed)
Patient has a couple of concerns and wants to know if she should come in to be seen.

## 2016-07-12 NOTE — Telephone Encounter (Signed)
She may have CTS . Will need evaluation. She can see me or PCP.

## 2016-07-12 NOTE — Telephone Encounter (Signed)
Attempted to contact the patient and left message for patient to call the office.  

## 2016-07-12 NOTE — Telephone Encounter (Signed)
Patient advised that she may have CTS and she would need an evaluation. Patient advised she may come here or go to PCP. Patient states she will call back if she would like to schedule her appointment here.

## 2016-09-03 ENCOUNTER — Other Ambulatory Visit: Payer: Self-pay | Admitting: Radiology

## 2016-09-03 DIAGNOSIS — M3219 Other organ or system involvement in systemic lupus erythematosus: Secondary | ICD-10-CM

## 2016-09-03 DIAGNOSIS — Z79899 Other long term (current) drug therapy: Secondary | ICD-10-CM

## 2016-09-03 LAB — CBC WITH DIFFERENTIAL/PLATELET
BASOS PCT: 0 %
Basophils Absolute: 0 cells/uL (ref 0–200)
EOS ABS: 35 {cells}/uL (ref 15–500)
EOS PCT: 1 %
HCT: 36.6 % (ref 35.0–45.0)
Hemoglobin: 11.1 g/dL — ABNORMAL LOW (ref 11.7–15.5)
Lymphocytes Relative: 26 %
Lymphs Abs: 910 cells/uL (ref 850–3900)
MCH: 23.1 pg — ABNORMAL LOW (ref 27.0–33.0)
MCHC: 30.3 g/dL — ABNORMAL LOW (ref 32.0–36.0)
MCV: 76.3 fL — ABNORMAL LOW (ref 80.0–100.0)
MONOS PCT: 7 %
MPV: 9.6 fL (ref 7.5–12.5)
Monocytes Absolute: 245 cells/uL (ref 200–950)
NEUTROS ABS: 2310 {cells}/uL (ref 1500–7800)
Neutrophils Relative %: 66 %
PLATELETS: 234 10*3/uL (ref 140–400)
RBC: 4.8 MIL/uL (ref 3.80–5.10)
RDW: 15.7 % — AB (ref 11.0–15.0)
WBC: 3.5 10*3/uL — AB (ref 3.8–10.8)

## 2016-09-04 LAB — COMPLETE METABOLIC PANEL WITH GFR
ALBUMIN: 4.5 g/dL (ref 3.6–5.1)
ALK PHOS: 69 U/L (ref 33–130)
ALT: 20 U/L (ref 6–29)
AST: 18 U/L (ref 10–35)
BILIRUBIN TOTAL: 0.3 mg/dL (ref 0.2–1.2)
BUN: 17 mg/dL (ref 7–25)
CO2: 24 mmol/L (ref 20–32)
Calcium: 9.8 mg/dL (ref 8.6–10.4)
Chloride: 103 mmol/L (ref 98–110)
Creat: 1.26 mg/dL — ABNORMAL HIGH (ref 0.50–1.05)
GFR, Est African American: 57 mL/min — ABNORMAL LOW (ref >=60)
GFR, Est Non African American: 50 mL/min — ABNORMAL LOW (ref >=60)
GLUCOSE: 74 mg/dL (ref 65–99)
POTASSIUM: 4.2 mmol/L (ref 3.5–5.3)
SODIUM: 137 mmol/L (ref 135–146)
TOTAL PROTEIN: 8 g/dL (ref 6.1–8.1)

## 2016-09-04 LAB — URINALYSIS, ROUTINE W REFLEX MICROSCOPIC
BILIRUBIN URINE: NEGATIVE
GLUCOSE, UA: NEGATIVE
Hgb urine dipstick: NEGATIVE
Ketones, ur: NEGATIVE
LEUKOCYTES UA: NEGATIVE
Nitrite: NEGATIVE
PH: 5.5 (ref 5.0–8.0)
PROTEIN: NEGATIVE
Specific Gravity, Urine: 1.017 (ref 1.001–1.035)

## 2016-09-04 NOTE — Progress Notes (Signed)
Labs are stable.

## 2016-09-06 ENCOUNTER — Encounter: Payer: Self-pay | Admitting: Rheumatology

## 2016-09-06 ENCOUNTER — Ambulatory Visit (INDEPENDENT_AMBULATORY_CARE_PROVIDER_SITE_OTHER): Payer: BLUE CROSS/BLUE SHIELD | Admitting: Rheumatology

## 2016-09-06 ENCOUNTER — Telehealth: Payer: Self-pay | Admitting: Rheumatology

## 2016-09-06 VITALS — BP 122/64 | HR 82 | Resp 16 | Ht 64.0 in | Wt 219.0 lb

## 2016-09-06 DIAGNOSIS — M3219 Other organ or system involvement in systemic lupus erythematosus: Secondary | ICD-10-CM

## 2016-09-06 DIAGNOSIS — M791 Myalgia, unspecified site: Secondary | ICD-10-CM

## 2016-09-06 DIAGNOSIS — Z79899 Other long term (current) drug therapy: Secondary | ICD-10-CM | POA: Diagnosis not present

## 2016-09-06 DIAGNOSIS — L509 Urticaria, unspecified: Secondary | ICD-10-CM | POA: Diagnosis not present

## 2016-09-06 DIAGNOSIS — D649 Anemia, unspecified: Secondary | ICD-10-CM

## 2016-09-06 MED ORDER — BACLOFEN 10 MG PO TABS: 10.0000 mg | ORAL_TABLET | Freq: Three times a day (TID) | ORAL | 5 refills | Status: DC

## 2016-09-06 MED ORDER — HYDROXYCHLOROQUINE SULFATE 200 MG PO TABS: ORAL_TABLET | ORAL | 1 refills | Status: DC

## 2016-09-06 NOTE — Telephone Encounter (Signed)
Patient states Mr. Leane Call mentioned a name of a skin rash, and patient can not remember what he said. Can you call her with that info.

## 2016-09-06 NOTE — Progress Notes (Signed)
Office Visit Note  Patient: Stephanie Myers             Date of Birth: [birth year]           MRN: [id number]             PCP: Jefm Petty, MD Referring: Jefm Petty, MD Visit Date: 09/06/2016 Occupation: '@GUAROCC'$ @    Subjective:  Medication Management   History of Present Illness: Stephanie Myers is a 51 y.o. female  Who was last seen in our office on 05/07/2016 for systemic lupus erythematosus and high risk prescription (Plaquenil 2 times a day Monday through Friday ). Patient reports that she was doing well with her lupus, no oral or nasal ulcers, no fatigue. Her Plaquenil eye exam was negative 01/17/2016.  Today, patient reports that she is doing well with her lupus except she has rashes that come and go from time to time. She has taken a picture and showed it to me. She states that this is been going on for several years now. The picture that she shows me is identical to urticaria. Patient does not know what medication she takes that elicits that responds. She will be more mindful of what items she might be responding to to narrow down the offending agent.  She continues to do really well with her lupus. No oral or nasal ulcers. No fatigue. Her Plaquenil eye exam is negative as of January 2018. She takes Plaquenil 200 mg twice a day Monday through Friday.   Note: Patient's rash was present prior to patient taking Plaquenil. Therefore the rash is not coming from the Plaquenil. Also the rash is not coming from her lupus.  Activities of Daily Living:  Patient reports morning stiffness for 15 minutes.   Patient Denies nocturnal pain.  Difficulty dressing/grooming: Denies Difficulty climbing stairs: Denies Difficulty getting out of chair: Denies Difficulty using hands for taps, buttons, cutlery, and/or writing: Denies   Review of Systems  Constitutional: Negative for fatigue.  HENT: Negative for mouth sores and mouth dryness.   Eyes: Negative for dryness.  Respiratory:  Negative for shortness of breath.   Gastrointestinal: Negative for constipation and diarrhea.  Musculoskeletal: Negative for myalgias and myalgias.  Skin: Negative for sensitivity to sunlight.  Psychiatric/Behavioral: Negative for decreased concentration and sleep disturbance.    PMFS History:  Patient Active Problem List   Diagnosis Date Noted  . Essential hypertension 04/17/2016  . Other organ or system involvement in systemic lupus erythematosus ([place]) 12/20/2015  . High risk medication use 12/20/2015  . 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 Osteoarthritis of foot 12/20/2015  . Bilateral primary osteoarthritis of knee 12/20/2015    Past Medical History:  Diagnosis Date  . Anemia   . Diabetes mellitus without complication ([place])   . Hyperlipidemia   . Hypertension   . Lupus   . Shingles     No family history on file. Past Surgical History:  Procedure Laterality Date  . ABDOMINAL HYSTERECTOMY    . CESAREAN SECTION     Social History   Social History Narrative  . No narrative on file     Objective: Vital Signs: BP 122/64   Pulse 82   Resp 16   Ht '5\' 4"'$  (1.626 m)   Wt 219 lb (99.3 kg)   BMI 37.59 kg/m    Physical Exam  Constitutional: She is oriented to person, place, and time. She appears well-developed and well-nourished.  HENT:  Head: Normocephalic and atraumatic.  Eyes: Pupils  are equal, round, and reactive to light. EOM are normal.  Cardiovascular: Normal rate, regular rhythm and normal heart sounds.  Exam reveals no gallop and no friction rub.   No murmur heard. Pulmonary/Chest: Effort normal and breath sounds normal. She has no wheezes. She has no rales.  Abdominal: Soft. Bowel sounds are normal. She exhibits no distension. There is no tenderness. There is no guarding. No hernia.  Musculoskeletal: Normal range of motion. She exhibits no edema, tenderness or deformity.  Lymphadenopathy:    She has no cervical adenopathy.  Neurological: She is alert and  oriented to person, place, and time. Coordination normal.  Skin: Skin is warm and dry. Capillary refill takes less than 2 seconds. No rash noted.  Psychiatric: She has a normal mood and affect. Her behavior is normal.  Nursing note and vitals reviewed.    Musculoskeletal Exam:  Full range of motion of all joints Grip strength is equal and strong bilaterally Fibromyalgia tender points are 18/18 present (new for patient.  Will need to be evaluated for fms if sxs continue)  CDAI Exam: No CDAI exam completed.  No synovitis on examination  Investigation: No additional findings. Orders Only on 09/03/2016  Component Date Value Ref Range Status  . WBC 09/03/2016 3.5* 3.8 - 10.8 K/uL Final  . RBC 09/03/2016 4.80  3.80 - 5.10 MIL/uL Final  . Hemoglobin 09/03/2016 11.1* 11.7 - 15.5 g/dL Final  . HCT 09/03/2016 36.6  35.0 - 45.0 % Final  . MCV 09/03/2016 76.3* 80.0 - 100.0 fL Final  . MCH 09/03/2016 23.1* 27.0 - 33.0 pg Final  . MCHC 09/03/2016 30.3* 32.0 - 36.0 g/dL Final  . RDW 09/03/2016 15.7* 11.0 - 15.0 % Final  . Platelets 09/03/2016 234  140 - 400 K/uL Final  . MPV 09/03/2016 9.6  7.5 - 12.5 fL Final  . Neutro Abs 09/03/2016 2310  1,500 - 7,800 cells/uL Final  . Lymphs Abs 09/03/2016 910  850 - 3,900 cells/uL Final  . Monocytes Absolute 09/03/2016 245  200 - 950 cells/uL Final  . Eosinophils Absolute 09/03/2016 35  15 - 500 cells/uL Final  . Basophils Absolute 09/03/2016 0  0 - 200 cells/uL Final  . Neutrophils Relative % 09/03/2016 66  % Final  . Lymphocytes Relative 09/03/2016 26  % Final  . Monocytes Relative 09/03/2016 7  % Final  . Eosinophils Relative 09/03/2016 1  % Final  . Basophils Relative 09/03/2016 0  % Final  . Smear Review 09/03/2016 Criteria for review not met   Final  . Sodium 09/03/2016 137  135 - 146 mmol/L Final  . Potassium 09/03/2016 4.2  3.5 - 5.3 mmol/L Final  . Chloride 09/03/2016 103  98 - 110 mmol/L Final  . CO2 09/03/2016 24  20 - 32 mmol/L Final    Comment: ** Please note change in reference range(s). **     . Glucose, Bld 09/03/2016 74  65 - 99 mg/dL Final  . BUN 09/03/2016 17  7 - 25 mg/dL Final  . Creat 09/03/2016 1.26* 0.50 - 1.05 mg/dL Final   Comment:   For patients > or = 50 years of age: The upper reference limit for Creatinine is approximately 13% higher for people identified as African-American.     . Total Bilirubin 09/03/2016 0.3  0.2 - 1.2 mg/dL Final  . Alkaline Phosphatase 09/03/2016 69  33 - 130 U/L Final  . AST 09/03/2016 18  10 - 35 U/L Final  . ALT 09/03/2016 20  6 - 29 U/L Final  . Total Protein 09/03/2016 8.0  6.1 - 8.1 g/dL Final  . Albumin 09/03/2016 4.5  3.6 - 5.1 g/dL Final  . Calcium 09/03/2016 9.8  8.6 - 10.4 mg/dL Final  . GFR, Est African American 09/03/2016 57* >=60 mL/min Final  . GFR, Est Non African American 09/03/2016 50* >=60 mL/min Final  . Color, Urine 09/03/2016 YELLOW  YELLOW Final  . APPearance 09/03/2016 CLEAR  CLEAR Final  . Specific Gravity, Urine 09/03/2016 1.017  1.001 - 1.035 Final  . pH 09/03/2016 5.5  5.0 - 8.0 Final  . Glucose, UA 09/03/2016 NEGATIVE  NEGATIVE Final  . Bilirubin Urine 09/03/2016 NEGATIVE  NEGATIVE Final  . Ketones, ur 09/03/2016 NEGATIVE  NEGATIVE Final  . Hgb urine dipstick 09/03/2016 NEGATIVE  NEGATIVE Final  . Protein, ur 09/03/2016 NEGATIVE  NEGATIVE Final  . Nitrite 09/03/2016 NEGATIVE  NEGATIVE Final  . Leukocytes, UA 09/03/2016 NEGATIVE  NEGATIVE Final  Orders Only on 04/25/2016  Component Date Value Ref Range Status  . WBC 04/25/2016 3.6* 3.8 - 10.8 K/uL Final  . RBC 04/25/2016 4.67  3.80 - 5.10 MIL/uL Final  . Hemoglobin 04/25/2016 10.6* 11.7 - 15.5 g/dL Final  . HCT 04/25/2016 35.6  35.0 - 45.0 % Final  . MCV 04/25/2016 76.2* 80.0 - 100.0 fL Final  . MCH 04/25/2016 22.7* 27.0 - 33.0 pg Final  . MCHC 04/25/2016 29.8* 32.0 - 36.0 g/dL Final  . RDW 04/25/2016 16.4* 11.0 - 15.0 % Final  . Platelets 04/25/2016 239  140 - 400 K/uL Final  . MPV  04/25/2016 10.7  7.5 - 12.5 fL Final  . Neutro Abs 04/25/2016 2484  1,500 - 7,800 cells/uL Final  . Lymphs Abs 04/25/2016 684* 850 - 3,900 cells/uL Final  . Monocytes Absolute 04/25/2016 324  200 - 950 cells/uL Final  . Eosinophils Absolute 04/25/2016 108  15 - 500 cells/uL Final  . Basophils Absolute 04/25/2016 0  0 - 200 cells/uL Final  . Neutrophils Relative % 04/25/2016 69  % Final  . Lymphocytes Relative 04/25/2016 19  % Final  . Monocytes Relative 04/25/2016 9  % Final  . Eosinophils Relative 04/25/2016 3  % Final  . Basophils Relative 04/25/2016 0  % Final  . Smear Review 04/25/2016 Criteria for review not met   Final  . Sodium 04/25/2016 137  135 - 146 mmol/L Final  . Potassium 04/25/2016 4.7  3.5 - 5.3 mmol/L Final  . Chloride 04/25/2016 104  98 - 110 mmol/L Final  . CO2 04/25/2016 25  20 - 31 mmol/L Final  . Glucose, Bld 04/25/2016 84  65 - 99 mg/dL Final  . BUN 04/25/2016 17  7 - 25 mg/dL Final  . Creat 04/25/2016 1.34* 0.50 - 1.10 mg/dL Final  . Total Bilirubin 04/25/2016 0.3  0.2 - 1.2 mg/dL Final  . Alkaline Phosphatase 04/25/2016 56  33 - 115 U/L Final  . AST 04/25/2016 19  10 - 35 U/L Final  . ALT 04/25/2016 23  6 - 29 U/L Final  . Total Protein 04/25/2016 7.4  6.1 - 8.1 g/dL Final  . Albumin 04/25/2016 4.3  3.6 - 5.1 g/dL Final  . Calcium 04/25/2016 9.9  8.6 - 10.2 mg/dL Final  . GFR, Est African American 04/25/2016 54* >=60 mL/min Final  . GFR, Est Non African American 04/25/2016 47* >=60 mL/min Final  . Color, Urine 04/25/2016 YELLOW  YELLOW Final  . APPearance 04/25/2016 CLEAR  CLEAR Final  . Specific Gravity,  Urine 04/25/2016 1.010  1.001 - 1.035 Final  . pH 04/25/2016 5.5  5.0 - 8.0 Final  . Glucose, UA 04/25/2016 NEGATIVE  NEGATIVE Final  . Bilirubin Urine 04/25/2016 NEGATIVE  NEGATIVE Final  . Ketones, ur 04/25/2016 NEGATIVE  NEGATIVE Final  . Hgb urine dipstick 04/25/2016 NEGATIVE  NEGATIVE Final  . Protein, ur 04/25/2016 NEGATIVE  NEGATIVE Final  .  Nitrite 04/25/2016 NEGATIVE  NEGATIVE Final  . Leukocytes, UA 04/25/2016 1+* NEGATIVE Final  . WBC, UA 04/25/2016 0-5  <=5 WBC/HPF Final  . RBC / HPF 04/25/2016 NONE SEEN  <=2 RBC/HPF Final  . Squamous Epithelial / LPF 04/25/2016 6-10* <=5 HPF Final  . Bacteria, UA 04/25/2016 NONE SEEN  NONE SEEN HPF Final  . Crystals 04/25/2016 NONE SEEN  NONE SEEN HPF Final  . Casts 04/25/2016 NONE SEEN  NONE SEEN LPF Final  . Yeast 04/25/2016 NONE SEEN  NONE SEEN HPF Final     Imaging: No results found.  Speciality Comments: No specialty comments available.    Procedures:  No procedures performed Allergies: Lisinopril   Assessment / Plan:     Visit Diagnoses: Other organ or system involvement in systemic lupus erythematosus (HCC)  High risk medication use  Myalgia - new for patient; has had these myalgias since at least 6 months.  Urticaria - without shortness of breath   Plan: 1: Systemic lupus erythematosus. Patient is doing well. No flare. No oral or nasal ulcers. No lupus rash. No added fatigue. Adequate response to Plaquenil  2: Myalgia. 18/18 tender points --> trial of baclofen  patient's complaints today included myalgia to various areas area On examination, she had 18 out of 18 tender points consistent with fibromyalgia. We have not worked her up for this. We will do a trial of baclofen. At the next visit, she can discuss this with her provider to see if her symptoms responded to the baclofen and they will reevaluate her to see if they also agreed that they may be a component of myalgia and perhaps fibromyalgia for this patient. At this time she does not carry the diagnosis of fibromyalgia.  #3: High risk prescription Plaquenil 200 mg twice a day Monday through Friday Adequate response Note: Patient's labs are within normal limits except for chronic anemia she can discuss that with her PCP  #4: Urticaria. Patient intermittently has urticaria. She showed me a  photograph consistent with urticaria type rash. No associated shortness of breath. I advised the patient that she should restart her Zyrtec once again 10 mg daily at bedtime.  #5: Return to clinic in 4 months (she will need CBC with differential, CMP with GFR, urinalysis at the next office visit)  #6: Chronic anemia. She can discuss that with her PCP   Orders: No orders of the defined types were placed in this encounter.  Meds ordered this encounter  Medications  . hydroxychloroquine (PLAQUENIL) 200 MG tablet    Sig: TAKE 1 TABLET BY MOUTH TWICE DAILY MONDAY THROUGH FRIDAY    Dispense:  120 tablet    Refill:  1    Order Specific Question:   Supervising Provider    Answer:   Bo Merino [[id number]]  . baclofen (LIORESAL) 10 MG tablet    Sig: Take 1 tablet (10 mg total) by mouth 3 (three) times daily.    Dispense:  30 each    Refill:  5    Order Specific Question:   Supervising Provider    Answer:   Bo Merino [  [id number]]    Face-to-face time spent with patient was 30 minutes. 50% of time was spent in counseling and coordination of care.  Follow-Up Instructions: Return in about 4 months (around 01/06/2017) for sle,plq 200bidm-f,18/18tenderpoints,.   Eliezer Lofts, PA-C  Note - This record has been created using [employer].  Chart creation errors have been sought, but may not always  have been located. Such creation errors do not reflect on  the standard of medical care.

## 2016-09-07 NOTE — Telephone Encounter (Signed)
Per office note: Patient intermittently has urticaria. She showed me a photograph consistent with urticaria type rash.  Patient advised.

## 2017-01-09 NOTE — Progress Notes (Signed)
Office Visit Note  Patient: Stephanie Myers             Date of Birth: July 17, 1965           MRN: [id number]             PCP: Loyal JacobsonKalish, Michael, MD Referring: Loyal JacobsonKalish, Michael, MD Visit Date: 01/22/2017 Occupation: @GUAROCC @    Subjective:  Right SI joint pain    History of Present Illness: Stephanie Myers is a 51 y.o. female with a history of systemic lupus erythematosus and osteoarthritis.  Patient denies any recent flares of lupus.  She denies any joint pain, joint swelling, rashes, mouth or nasal ulcers.  She denies any swollen lymph nodes.  She continues to take Plaquenil 200 mg BID M-F.  She does not need any refills at this time.  She states recently she has been experiencing pain in her right SI joint that has caused radiation of pain down her right leg.  She denies any numbness or tingling.  She states the pain is worse after standing for prolonged periods of time.  Her PCP recently diagnosed her with bilateral lateral epicondylitis, which she has been performing exercises daily.      Activities of Daily Living:  Patient reports morning stiffness for 30 minutes.   Patient Denies nocturnal pain.  Difficulty dressing/grooming: Denies Difficulty climbing stairs: Denies Difficulty getting out of chair: Denies Difficulty using hands for taps, buttons, cutlery, and/or writing: Reports   Review of Systems  Constitutional: Negative for fatigue and weakness.  HENT: Negative for mouth sores, mouth dryness and nose dryness.   Eyes: Negative for redness, visual disturbance and dryness.  Respiratory: Negative for cough, hemoptysis, shortness of breath and difficulty breathing.   Cardiovascular: Negative for chest pain, palpitations, hypertension, irregular heartbeat and swelling in legs/feet.  Gastrointestinal: Negative for blood in stool, constipation and diarrhea.  Endocrine: Negative for increased urination.  Genitourinary: Negative for involuntary urination.  Musculoskeletal: Positive  for morning stiffness. Negative for arthralgias, joint pain, joint swelling, myalgias, muscle weakness, muscle tenderness and myalgias.  Skin: Negative for color change, pallor, rash, hair loss, nodules/bumps, redness, skin tightness, ulcers and sensitivity to sunlight.  Neurological: Negative for dizziness, numbness and headaches.  Hematological: Negative for swollen glands.  Psychiatric/Behavioral: Negative for depressed mood and sleep disturbance. The patient is not nervous/anxious.     PMFS History:  Patient Active Problem List   Diagnosis Date Noted  . Essential hypertension 04/17/2016  . 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 High risk medication use 12/20/2015  . 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 Osteoarthritis of foot 12/20/2015  . Bilateral primary osteoarthritis of knee 12/20/2015    Past Medical History:  Diagnosis Date  . Anemia   . Diabetes mellitus without complication ([facility])   . Hyperlipidemia   . Hypertension   . Lupus   . Shingles     History reviewed. No pertinent family history. Past Surgical History:  Procedure Laterality Date  . ABDOMINAL HYSTERECTOMY    . CESAREAN SECTION     Social History   Social History Narrative  . Not on file     Objective: Vital Signs: BP 107/81 (BP Location: Left Arm, Patient Position: Sitting, Cuff Size: Normal)   Pulse 92   Resp 15   Ht 5\' 4"  (1.626 m)   Wt 213 lb (96.6 kg)   BMI 36.56 kg/m    Physical Exam  Constitutional: She is oriented to person, place,  and time. She appears well-developed and well-nourished.  HENT:  Head: Normocephalic and atraumatic.  Eyes: Conjunctivae and EOM are normal.  Neck: Normal range of motion.  Cardiovascular: Normal rate, regular rhythm, normal heart sounds and intact distal pulses.  Pulmonary/Chest: Effort normal and breath sounds normal.  Abdominal: Soft. Bowel sounds are normal.  Lymphadenopathy:    She has no cervical  adenopathy.  Neurological: She is alert and oriented to person, place, and time.  Skin: Skin is warm and dry. Capillary refill takes less than 2 seconds.  Psychiatric: She has a normal mood and affect. Her behavior is normal.  Nursing note and vitals reviewed.    Musculoskeletal Exam: C-spine, thoracic, and lumbar spine good ROM.  She has no midline spinal tenderness.  She has SI joint tenderness.  Shoulder joints, elbow joints, wrist joints good ROM with no synovitis.  MCPs, PIPs, and DIPs good ROM with no synovitis.  Hip joints, knee joints, ankle joints, MTPs, PIPs, and DIPs good ROM with no synovitis.  She has tenderness of right trochanteric bursitis.  CDAI Exam: No CDAI exam completed.    Investigation: No additional findings.PLQ eye exam: 01/17/2016 CBC Latest Ref Rng & Units 09/03/2016 04/25/2016 12/19/2015  WBC 3.8 - 10.8 K/uL 3.5(L) 3.6(L) 3.6(L)  Hemoglobin 11.7 - 15.5 g/dL 11.1(L) 10.6(L) 10.5(L)  Hematocrit 35.0 - 45.0 % 36.6 35.6 34.6(L)  Platelets 140 - 400 K/uL 234 239 209   CMP Latest Ref Rng & Units 09/03/2016 04/25/2016 12/19/2015  Glucose 65 - 99 mg/dL 74 84 161(W103(H)  BUN 7 - 25 mg/dL 17 17 19   Creatinine 0.50 - 1.05 mg/dL 9.60(A1.26(H) 5.40(J1.34(H) 8.11(B1.22(H)  Sodium 135 - 146 mmol/L 137 137 137  Potassium 3.5 - 5.3 mmol/L 4.2 4.7 4.3  Chloride 98 - 110 mmol/L 103 104 104  CO2 20 - 32 mmol/L 24 25 27   Calcium 8.6 - 10.4 mg/dL 9.8 9.9 9.8  Total Protein 6.1 - 8.1 g/dL 8.0 7.4 7.4  Total Bilirubin 0.2 - 1.2 mg/dL 0.3 0.3 0.2  Alkaline Phos 33 - 130 U/L 69 56 62  AST 10 - 35 U/L 18 19 18   ALT 6 - 29 U/L 20 23 23     Imaging: No results found.  Speciality Comments: No specialty comments available.    Procedures:  No procedures performed Allergies: Lisinopril   Assessment / Plan:     Visit Diagnoses: Other organ or system involvement in systemic lupus erythematosus ([facility]) - +ANA, +ds DNA, + Smith, RNP and Ro:  She has had no recent flares.  She has no mouth or nasal ulcers on  exam.  No synovitis.  No lymphadenopathy.  She will continue on Plaquenil 200 mg BID Monday-Friday.  She was given a PLQ eye exam form. Discussed the cardiovascular risk in lupus patients as well as the importance of diet and exercise.  Lupus labs were drawn today. - Plan: Anti-DNA antibody, double-stranded, C3 and C4, Sedimentation rate, Urinalysis, Routine w reflex microscopic, COMPLETE METABOLIC PANEL WITH GFR, CBC with Differential/Platelet, VITAMIN D 25 Hydroxy (Vit-D Deficiency, Fractures)  High risk medication use - PLQ eye exam: 01/17/2016.  She was given an eye exam form.  A CBC and CMP were drawn today for medication monitoring. - Plan: COMPLETE METABOLIC PANEL WITH GFR, CBC with Differential/Platelet  Primary osteoarthritis of both hands: She has no synovitis on exam.  She has no discomfort in there hands.    Primary osteoarthritis of both feet: No discomfort or synovitis on exam.  Proper fitting  shoes were discussed.    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    Lateral epicondylitis, left elbow: She is performing daily exercises provided by her PCP.  Discussed a brace that she can wear if her symptoms worsen.    Lateral epicondylitis, right elbow:She is performing daily exercises provided by her PCP.  Discussed a brace that she can wear if her symptoms worsen.    Chronic right SI joint pain: She has tenderness of her right SI joint.  We showed her stretching exercises that she can do at home.  She declined a cortisone injection at this time.     Trochanteric bursitis, right hip: She has tenderness of her right trochanteric bursa.  She was given a handout of exercises she can perform at home.  She declined a cortisone injection at this time.    History of hypertension: Well controlled in the office today.     Orders: Orders Placed This Encounter  Procedures  . Anti-DNA antibody, double-stranded  . C3 and C4    . Sedimentation rate  . Urinalysis, Routine w reflex microscopic  . COMPLETE METABOLIC PANEL WITH GFR  . CBC with Differential/Platelet  . VITAMIN D 25 Hydroxy (Vit-D Deficiency, Fractures)   No orders of the defined types were placed in this encounter.   Face-to-face time spent with patient was 30 minutes.Greater than 50% of time was spent in counseling and coordination of care.  Follow-Up Instructions: Return in about 4 months (around 05/22/2017) for Systemic lupus erythematosus, Osteoarthritis.   Pollyann Savoy, MD  Note - This record has been created using [employer].  Chart creation errors have been sought, but may not always  have been located. Such creation errors do not reflect on  the standard of medical care.

## 2017-01-22 ENCOUNTER — Encounter: Payer: Self-pay | Admitting: Rheumatology

## 2017-01-22 ENCOUNTER — Telehealth: Payer: Self-pay | Admitting: Rheumatology

## 2017-01-22 ENCOUNTER — Other Ambulatory Visit (INDEPENDENT_AMBULATORY_CARE_PROVIDER_SITE_OTHER): Payer: Self-pay | Admitting: Physician Assistant

## 2017-01-22 ENCOUNTER — Encounter (INDEPENDENT_AMBULATORY_CARE_PROVIDER_SITE_OTHER): Payer: Self-pay

## 2017-01-22 ENCOUNTER — Ambulatory Visit (INDEPENDENT_AMBULATORY_CARE_PROVIDER_SITE_OTHER): Payer: BLUE CROSS/BLUE SHIELD | Admitting: Rheumatology

## 2017-01-22 VITALS — BP 107/81 | HR 92 | Resp 15 | Ht 64.0 in | Wt 213.0 lb

## 2017-01-22 DIAGNOSIS — Z8679 Personal history of other diseases of the circulatory system: Secondary | ICD-10-CM

## 2017-01-22 DIAGNOSIS — M7712 Lateral epicondylitis, left elbow: Secondary | ICD-10-CM | POA: Diagnosis not present

## 2017-01-22 DIAGNOSIS — M533 Sacrococcygeal disorders, not elsewhere classified: Secondary | ICD-10-CM

## 2017-01-22 DIAGNOSIS — M170 Bilateral primary osteoarthritis of knee: Secondary | ICD-10-CM

## 2017-01-22 DIAGNOSIS — M7711 Lateral epicondylitis, right elbow: Secondary | ICD-10-CM | POA: Diagnosis not present

## 2017-01-22 DIAGNOSIS — G8929 Other chronic pain: Secondary | ICD-10-CM

## 2017-01-22 DIAGNOSIS — M19071 Primary osteoarthritis, right ankle and foot: Secondary | ICD-10-CM

## 2017-01-22 DIAGNOSIS — Z79899 Other long term (current) drug therapy: Secondary | ICD-10-CM | POA: Diagnosis not present

## 2017-01-22 DIAGNOSIS — M3219 Other organ or system involvement in systemic lupus erythematosus: Secondary | ICD-10-CM

## 2017-01-22 DIAGNOSIS — M19072 Primary osteoarthritis, left ankle and foot: Secondary | ICD-10-CM

## 2017-01-22 DIAGNOSIS — M19041 Primary osteoarthritis, right hand: Secondary | ICD-10-CM

## 2017-01-22 DIAGNOSIS — M7061 Trochanteric bursitis, right hip: Secondary | ICD-10-CM | POA: Diagnosis not present

## 2017-01-22 DIAGNOSIS — M19042 Primary osteoarthritis, left hand: Secondary | ICD-10-CM | POA: Diagnosis not present

## 2017-01-22 NOTE — Patient Instructions (Signed)
Trochanteric Bursitis Rehab Ask your health care provider which exercises are safe for you. Do exercises exactly as told by your health care provider and adjust them as directed. It is normal to feel mild stretching, pulling, tightness, or discomfort as you do these exercises, but you should stop right away if you feel sudden pain or your pain gets worse.Do not begin these exercises until told by your health care provider. Stretching exercises These exercises warm up your muscles and joints and improve the movement and flexibility of your hip. These exercises also help to relieve pain and stiffness. Exercise A: Iliotibial band stretch  1. Lie on your side with your left / right leg in the top position. 2. Bend your left / right knee and grab your ankle. 3. Slowly bring your knee back so your thigh is behind your body. 4. Slowly lower your knee toward the floor until you feel a gentle stretch on the outside of your left / right thigh. If you do not feel a stretch and your knee will not fall farther, place the heel of your other foot on top of your outer knee and pull your thigh down farther. 5. Hold this position for __________ seconds. 6. Slowly return to the starting position. Repeat __________ times. Complete this exercise __________ times a day. Strengthening exercises These exercises build strength and endurance in your hip and pelvis. Endurance is the ability to use your muscles for a long time, even after they get tired. Exercise B: Bridge ( hip extensors) 1. Lie on your back on a firm surface with your knees bent and your feet flat on the floor. 2. Tighten your buttocks muscles and lift your buttocks off the floor until your trunk is level with your thighs. You should feel the muscles working in your buttocks and the back of your thighs. If this exercise is too easy, try doing it with your arms crossed over your chest. 3. Hold this position for __________ seconds. 4. Slowly return to the  starting position. 5. Let your muscles relax completely between repetitions. Repeat __________ times. Complete this exercise __________ times a day. Exercise C: Squats ( knee extensors and  quadriceps) 1. Stand in front of a table, with your feet and knees pointing straight ahead. You may rest your hands on the table for balance but not for support. 2. Slowly bend your knees and lower your hips like you are going to sit in a chair. ? Keep your weight over your heels, not over your toes. ? Keep your lower legs upright so they are parallel with the table legs. ? Do not let your hips go lower than your knees. ? Do not bend lower than told by your health care provider. ? If your hip pain increases, do not bend as low. 3. Hold this position for __________ seconds. 4. Slowly push with your legs to return to standing. Do not use your hands to pull yourself to standing. Repeat __________ times. Complete this exercise __________ times a day. Exercise D: Hip hike 1. Stand sideways on a bottom step. Stand on your left / right leg with your other foot unsupported next to the step. You can hold onto the railing or wall if needed for balance. 2. Keeping your knees straight and your torso square, lift your left / right hip up toward the ceiling. 3. Hold this position for __________ seconds. 4. Slowly let your left / right hip lower toward the floor, past the starting position. Your foot   should get closer to the floor. Do not lean or bend your knees. Repeat __________ times. Complete this exercise __________ times a day. Exercise E: Single leg stand 1. Stand near a counter or door frame that you can hold onto for balance as needed. It is helpful to stand in front of a mirror for this exercise so you can watch your hip. 2. Squeeze your left / right buttock muscles then lift up your other foot. Do not let your left / right hip push out to the side. 3. Hold this position for __________ seconds. Repeat  __________ times. Complete this exercise __________ times a day. This information is not intended to replace advice given to you by your health care provider. Make sure you discuss any questions you have with your health care provider. Document Released: 02/09/2004 Document Revised: 09/08/2015 Document Reviewed: 12/17/2014 Elsevier Interactive Patient Education  2018 [employer] Sacroiliac Joint Dysfunction Sacroiliac joint dysfunction is a condition that causes inflammation on one or both sides of the sacroiliac (SI) joint. The SI joint connects the lower part of the spine (sacrum) with the two upper portions of the pelvis (ilium). This condition causes deep aching or burning pain in the low back. In some cases, the pain may also spread into one or both buttocks or hips or spread down the legs. What are the causes? This condition may be caused by:  Pregnancy. During pregnancy, extra stress is put on the SI joints because the pelvis widens.  Injury, such as: ? Car accidents. ? Sport-related injuries. ? Work-related injuries.  Having one leg that is shorter than the other.  Conditions that affect the joints, such as: ? Rheumatoid arthritis. ? Gout. ? Psoriatic arthritis. ? Joint infection (septic arthritis).  Sometimes, the cause of SI joint dysfunction is not known. What are the signs or symptoms? Symptoms of this condition include:  Aching or burning pain in the lower back. The pain may also spread to other areas, such as: ? Buttocks. ? Groin. ? Thighs and legs.  Muscle spasms in or around the painful areas.  Increased pain when standing, walking, running, stair climbing, bending, or lifting.  How is this diagnosed? Your health care provider will do a physical exam and take your medical history. During the exam, the health care provider may move one or both of your legs to different positions to check for pain. Various tests may be done to help verify the diagnosis,  including:  Imaging tests to look for other causes of pain. These may include: ? MRI. ? CT scan. ? Bone scan.  Diagnostic injection. A numbing medicine is injected into the SI joint using a needle. If the pain is temporarily improved or stopped after the injection, this can indicate that SI joint dysfunction is the problem.  How is this treated? Treatment may vary depending on the cause and severity of your condition. Treatment options may include:  Applying ice or heat to the lower back area. This can help to reduce pain and muscle spasms.  Medicines to relieve pain or inflammation or to relax the muscles.  Wearing a back brace (sacroiliac brace) to help support the joint while your back is healing.  Physical therapy to increase muscle strength around the joint and flexibility at the joint. This may also involve learning proper body positions and ways of moving to relieve stress on the joint.  Direct manipulation of the SI joint.  Injections of steroid medicine into the joint in order to reduce   pain and swelling.  Radiofrequency ablation to burn away nerves that are carrying pain messages from the joint.  Use of a device that provides electrical stimulation in order to reduce pain at the joint.  Surgery to put in screws and plates that limit or prevent joint motion. This is rare.  Follow these instructions at home:  Rest as needed. Limit your activities as directed by your health care provider.  Take medicines only as directed by your health care provider.  If directed, apply ice to the affected area: ? Put ice in a plastic bag. ? Place a towel between your skin and the bag. ? Leave the ice on for 20 minutes, 2-3 times per day.  Use a heating pad or a moist heat pack as directed by your health care provider.  Exercise as directed by your health care provider or physical therapist.  Keep all follow-up visits as directed by your health care provider. This is  important. Contact a health care provider if:  Your pain is not controlled with medicine.  You have a fever.  You have increasingly severe pain. Get help right away if:  You have weakness, numbness, or tingling in your legs or feet.  You lose control of your bladder or bowel. This information is not intended to replace advice given to you by your health care provider. Make sure you discuss any questions you have with your health care provider. Document Released: 03/30/2008 Document Revised: 06/09/2015 Document Reviewed: 09/08/2013 Elsevier Interactive Patient Education  2018 [employer] Inc.  

## 2017-01-22 NOTE — Telephone Encounter (Signed)
Patient at [facility] now. Request an Eye Exam form to be faxed to them. Fax#[phone]. Patient wants to know if see is due for one. If not, Please call to let know.

## 2017-01-22 NOTE — Telephone Encounter (Signed)
PLQ Eye exam form faxed.

## 2017-01-23 LAB — CBC WITH DIFFERENTIAL/PLATELET
BASOS ABS: 20 {cells}/uL (ref 0–200)
Basophils Relative: 0.6 %
EOS ABS: 109 {cells}/uL (ref 15–500)
Eosinophils Relative: 3.2 %
HCT: 34.3 % — ABNORMAL LOW (ref 35.0–45.0)
HEMOGLOBIN: 10.5 g/dL — AB (ref 11.7–15.5)
Lymphs Abs: 762 cells/uL — ABNORMAL LOW (ref 850–3900)
MCH: 23 pg — ABNORMAL LOW (ref 27.0–33.0)
MCHC: 30.6 g/dL — AB (ref 32.0–36.0)
MCV: 75.2 fL — AB (ref 80.0–100.0)
MPV: 10.6 fL (ref 7.5–12.5)
Monocytes Relative: 7.1 %
NEUTROS ABS: 2268 {cells}/uL (ref 1500–7800)
NEUTROS PCT: 66.7 %
Platelets: 258 10*3/uL (ref 140–400)
RBC: 4.56 10*6/uL (ref 3.80–5.10)
RDW: 15.3 % — AB (ref 11.0–15.0)
Total Lymphocyte: 22.4 %
WBC: 3.4 10*3/uL — ABNORMAL LOW (ref 3.8–10.8)
WBCMIX: 241 {cells}/uL (ref 200–950)

## 2017-01-23 LAB — COMPLETE METABOLIC PANEL WITH GFR
AG Ratio: 1.2 (calc) (ref 1.0–2.5)
ALBUMIN MSPROF: 4.6 g/dL (ref 3.6–5.1)
ALT: 27 U/L (ref 6–29)
AST: 22 U/L (ref 10–35)
Alkaline phosphatase (APISO): 73 U/L (ref 33–130)
BILIRUBIN TOTAL: 0.3 mg/dL (ref 0.2–1.2)
BUN / CREAT RATIO: 15 (calc) (ref 6–22)
BUN: 19 mg/dL (ref 7–25)
CALCIUM: 9.9 mg/dL (ref 8.6–10.4)
CO2: 26 mmol/L (ref 20–32)
CREATININE: 1.26 mg/dL — AB (ref 0.50–1.05)
Chloride: 104 mmol/L (ref 98–110)
GFR, EST AFRICAN AMERICAN: 58 mL/min/{1.73_m2} — AB (ref >=60)
GFR, EST NON AFRICAN AMERICAN: 50 mL/min/{1.73_m2} — AB (ref >=60)
GLUCOSE: 77 mg/dL (ref 65–99)
Globulin: 3.7 g/dL (calc) (ref 1.9–3.7)
Potassium: 4 mmol/L (ref 3.5–5.3)
Sodium: 137 mmol/L (ref 135–146)
TOTAL PROTEIN: 8.3 g/dL — AB (ref 6.1–8.1)

## 2017-01-23 LAB — VITAMIN D 25 HYDROXY (VIT D DEFICIENCY, FRACTURES): VIT D 25 HYDROXY: 51 ng/mL (ref 30–100)

## 2017-01-23 LAB — C3 AND C4
C3 Complement: 130 mg/dL (ref 83–193)
C4 COMPLEMENT: 51 mg/dL (ref 15–57)

## 2017-01-23 LAB — ANTI-DNA ANTIBODY, DOUBLE-STRANDED: ds DNA Ab: 22 IU/mL — ABNORMAL HIGH

## 2017-01-23 LAB — URINALYSIS, ROUTINE W REFLEX MICROSCOPIC

## 2017-01-23 LAB — SEDIMENTATION RATE: Sed Rate: 22 mm/h — ABNORMAL HIGH (ref 0–20)

## 2017-01-23 NOTE — Progress Notes (Signed)
Discussed labs with Dr. Corliss Skainseveshwar.  The patient is clinically doing well on PLQ 200 mg BID M-F.  We will continue to monitor her labs.  DsDNA is elevated and sed rate is mildly elevated. CBC and CMP are stable.

## 2017-01-25 LAB — URINALYSIS, ROUTINE W REFLEX MICROSCOPIC
BILIRUBIN URINE: NEGATIVE
GLUCOSE, UA: NEGATIVE
Hgb urine dipstick: NEGATIVE
Ketones, ur: NEGATIVE
LEUKOCYTES UA: NEGATIVE
Nitrite: NEGATIVE
PROTEIN: NEGATIVE
SPECIFIC GRAVITY, URINE: 1.019 (ref 1.001–1.03)
pH: 5 (ref 5.0–8.0)

## 2017-01-25 NOTE — Progress Notes (Signed)
UA WNL

## 2017-04-09 ENCOUNTER — Other Ambulatory Visit: Payer: Self-pay | Admitting: *Deleted

## 2017-04-09 MED ORDER — HYDROXYCHLOROQUINE SULFATE 200 MG PO TABS: ORAL_TABLET | ORAL | 1 refills | Status: DC

## 2017-04-09 NOTE — Telephone Encounter (Signed)
Last Visit: 01/22/17 Next Visit: 05/23/17 Labs: 01/22/17 cbc/cmp stable PLQ Eye Exam: 02/05/17 WNL   Okay to refill per Dr. Corliss Skainseveshwar

## 2017-05-10 NOTE — Progress Notes (Signed)
Office Visit Note  Patient: Stephanie Myers             Date of Birth: [birth year]           MRN: [id number]             PCP: Loyal Jacobson, MD Referring: Loyal Jacobson, MD Visit Date: 05/23/2017 Occupation: @GUAROCC @    Subjective:  Medication monitoring    History of Present Illness: Stephanie Myers is a 52 y.o. female with history of systemic lupus erythematosus and osteoarthritis.  She denies any recent lupus flares.  She continues to take Plaquenil 200 mg twice daily Monday through Friday.  She has no sores in her mouth or nose.  She denies any recent rashes.  She denies any joint pain or joint swelling at this time.  She denies any swollen lymph nodes.  She denies any palpitations or shortness of breath.  She denies any symptoms of Raynolds or sicca symptoms.  She denies any recent low-grade fevers or worsening fatigue.  She denies any hair loss.  She continues to have photosensitivity.  She states that her right trochanteric bursitis has resolved.  She states that her bilateral lateral epicondylitis is also improved and she has occasional discomfort and does her stretches which resolved her symptoms.   Activities of Daily Living:  Patient reports morning stiffness for 0 minutes.   Patient Denies nocturnal pain.  Difficulty dressing/grooming: Denies Difficulty climbing stairs: Denies Difficulty getting out of chair: Denies Difficulty using hands for taps, buttons, cutlery, and/or writing: Denies   Review of Systems  Constitutional: Negative for fatigue.  HENT: Negative for mouth sores, mouth dryness and nose dryness.   Eyes: Negative for pain, visual disturbance and dryness.  Respiratory: Negative for cough, hemoptysis, shortness of breath and difficulty breathing.   Cardiovascular: Negative for chest pain, palpitations, hypertension and swelling in legs/feet.  Gastrointestinal: Negative for blood in stool, constipation and diarrhea.  Endocrine: Negative for increased  urination.  Genitourinary: Negative for painful urination.  Musculoskeletal: Negative for arthralgias, joint pain, joint swelling, myalgias, muscle weakness, morning stiffness, muscle tenderness and myalgias.  Skin: Positive for sensitivity to sunlight. Negative for color change, pallor, rash, hair loss, nodules/bumps, skin tightness and ulcers.  Allergic/Immunologic: Negative for susceptible to infections.  Neurological: Negative for dizziness, numbness, headaches and weakness.  Hematological: Negative for swollen glands.  Psychiatric/Behavioral: Negative for depressed mood and sleep disturbance. The patient is not nervous/anxious.     PMFS History:  Patient Active Problem List   Diagnosis Date Noted  . Essential hypertension 04/17/2016  . 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 High risk medication use 12/20/2015  . 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 Osteoarthritis of foot 12/20/2015  . Bilateral primary osteoarthritis of knee 12/20/2015    Past Medical History:  Diagnosis Date  . Anemia   . Diabetes mellitus without complication (HCC)   . Hyperlipidemia   . Hypertension   . Lupus (HCC)   . Shingles     Family History  Problem Relation Age of Onset  . Healthy Daughter   . Healthy Son    Past Surgical History:  Procedure Laterality Date  . ABDOMINAL HYSTERECTOMY    . CESAREAN SECTION     Social History   Social History Narrative  . Not on file     Objective: Vital Signs: BP 107/75 (BP Location: Left Arm, Patient Position: Sitting, Cuff Size: Normal)   Pulse 78   Resp 13  Ht 5\' 4"  (1.626 m)   Wt 210 lb (95.3 kg)   BMI 36.05 kg/m    Physical Exam  Constitutional: She is oriented to person, place, and time. She appears well-developed and well-nourished.  HENT:  Head: Normocephalic and atraumatic.  Eyes: Conjunctivae and EOM are normal.  Neck: Normal range of motion.  Cardiovascular: Normal rate, regular rhythm,  normal heart sounds and intact distal pulses.  Pulmonary/Chest: Effort normal and breath sounds normal.  Abdominal: Soft. Bowel sounds are normal.  Lymphadenopathy:    She has no cervical adenopathy.  Neurological: She is alert and oriented to person, place, and time.  Skin: Skin is warm and dry. Capillary refill takes less than 2 seconds.  Psychiatric: She has a normal mood and affect. Her behavior is normal.  Nursing note and vitals reviewed.    Musculoskeletal Exam: C-spine, thoracic spine, lumbar spine good range of motion.  No midline spinal tenderness.  No SI joint tenderness.  Shoulder joints, elbow joints, wrist joints, MCPs, PIPs, DIPs good range of motion with no synovitis.  Hip joints, knee joints, ankle joints, MTPs, PIPs, DIPs good range of motion with no synovitis.  No warmth or effusion of knee joints.  No knee crepitus.  No tenderness of trochanteric bursa bilaterally.  CDAI Exam: No CDAI exam completed.    Investigation: No additional findings.PLQ eye exam: 02/05/2017 CBC Latest Ref Rng & Units 01/22/2017 09/03/2016 04/25/2016  WBC 3.8 - 10.8 Thousand/uL 3.4(L) 3.5(L) 3.6(L)  Hemoglobin 11.7 - 15.5 g/dL 10.5(L) 11.1(L) 10.6(L)  Hematocrit 35.0 - 45.0 % 34.3(L) 36.6 35.6  Platelets 140 - 400 Thousand/uL 258 234 239   CMP Latest Ref Rng & Units 01/22/2017 09/03/2016 04/25/2016  Glucose 65 - 99 mg/dL 77 74 84  BUN 7 - 25 mg/dL 19 17 17   Creatinine 0.50 - 1.05 mg/dL 2.95(A) 2.13(Y) 8.65(H)  Sodium 135 - 146 mmol/L 137 137 137  Potassium 3.5 - 5.3 mmol/L 4.0 4.2 4.7  Chloride 98 - 110 mmol/L 104 103 104  CO2 20 - 32 mmol/L 26 24 25   Calcium 8.6 - 10.4 mg/dL 9.9 9.8 9.9  Total Protein 6.1 - 8.1 g/dL 8.3(H) 8.0 7.4  Total Bilirubin 0.2 - 1.2 mg/dL 0.3 0.3 0.3  Alkaline Phos 33 - 130 U/L - 69 56  AST 10 - 35 U/L 22 18 19   ALT 6 - 29 U/L 27 20 23     Imaging: No results found.  Speciality Comments: PLQ Eye Exam: 02/05/17 WNL @ [facility] follow up in 6 months      Procedures:  No procedures performed Allergies: Lisinopril   Assessment / Plan:     Visit Diagnoses: Other organ or system involvement in systemic lupus erythematosus (HCC) - +ANA, +ds DNA, + Smith, RNP and Ro: She has not had any recent lupus flares.  She has no oral or nasal ulcerations on exam.  She is no cervical lymphadenopathy.  She has no synovitis on exam.  She is not having joint pain or joint swelling at this time.  She has no malar rash noted.  She continues to have photosensitivity.  She does not report any recent hair loss.  She has not had any worsening fatigue or low-grade fevers.  No palpitations or shortness of breath.  No pericardial or pleural rub noted.  She has no symptoms of Raynolds or sicca symptoms at this time.  She is clinically doing very well on Plaquenil 200 mg twice daily Monday through Friday.  She does not  need any refills at this time. We will recheck autoimmune labs today. - Plan: Anti-DNA antibody, double-stranded, C3 and C4, Sedimentation rate, Urinalysis, Routine w reflex microscopic, ANA, CBC with Differential/Platelet, COMPLETE METABOLIC PANEL WITH GFR  High risk medication use - PLQ.eye exam: 02/05/2017 -CBC and CMP will be drawn today to monitor for drug toxicity.  Plan: CBC with Differential/Platelet, COMPLETE METABOLIC PANEL WITH GFR  Primary osteoarthritis of both hands: She has no discomfort in her hands at this time.  She has no synovitis noted.  Joint protection muscle strengthening were discussed.  Primary osteoarthritis of both feet: Mild PIP and DIP synovial thickening consistent with osteoarthritis of bilateral feet.  She has no pain in her feet at this time.  She wears proper fitting shoes.  Bilateral primary osteoarthritis of knee: No warmth or effusion of knee joints.  She has good range of motion.  She has no knee crepitus.  She has no discomfort in her knees at this time.  Lateral epicondylitis, left elbow: No tenderness on exam.  Her  symptoms have improved significantly.  She will occasionally have discomfort and does the stretching exercises which resolved her symptoms.  She does not need a brace at this time.  Lateral epicondylitis, right elbow: She has no tenderness on exam.  Her symptoms have improved with stretching exercises.  Trochanteric bursitis, right hip: Resolved.  She has no tenderness on exam.  History of hypertension: Blood pressure is well controlled in the office today.  Her blood pressure was 107/75.   Orders: Orders Placed This Encounter  Procedures  . Anti-DNA antibody, double-stranded  . C3 and C4  . Sedimentation rate  . Urinalysis, Routine w reflex microscopic  . ANA  . CBC with Differential/Platelet  . COMPLETE METABOLIC PANEL WITH GFR   No orders of the defined types were placed in this encounter.    Follow-Up Instructions: Return in about 5 months (around 10/23/2017) for Systemic lupus erythematosus, Osteoarthritis.   Gearldine Bienenstockaylor M Dale, PA-C   I examined and evaluated the patient with Sherron Alesaylor Dale PA.  She appears to be clinically stable.  She remains in some fatigue.  We will obtain some labs.  The plan of care was discussed as noted above.  Pollyann SavoyShaili Deveshwar, MD  Note - This record has been created using [employer] software.  Chart creation errors have been sought, but may not always  have been located. Such creation errors do not reflect on  the standard of medical care.

## 2017-05-23 ENCOUNTER — Encounter: Payer: Self-pay | Admitting: Rheumatology

## 2017-05-23 ENCOUNTER — Ambulatory Visit: Payer: BLUE CROSS/BLUE SHIELD | Admitting: Rheumatology

## 2017-05-23 VITALS — BP 107/75 | HR 78 | Resp 13 | Ht 64.0 in | Wt 210.0 lb

## 2017-05-23 DIAGNOSIS — M19071 Primary osteoarthritis, right ankle and foot: Secondary | ICD-10-CM

## 2017-05-23 DIAGNOSIS — M3219 Other organ or system involvement in systemic lupus erythematosus: Secondary | ICD-10-CM | POA: Diagnosis not present

## 2017-05-23 DIAGNOSIS — Z79899 Other long term (current) drug therapy: Secondary | ICD-10-CM

## 2017-05-23 DIAGNOSIS — M170 Bilateral primary osteoarthritis of knee: Secondary | ICD-10-CM

## 2017-05-23 DIAGNOSIS — M19042 Primary osteoarthritis, left hand: Secondary | ICD-10-CM | POA: Diagnosis not present

## 2017-05-23 DIAGNOSIS — M7061 Trochanteric bursitis, right hip: Secondary | ICD-10-CM

## 2017-05-23 DIAGNOSIS — M19072 Primary osteoarthritis, left ankle and foot: Secondary | ICD-10-CM | POA: Diagnosis not present

## 2017-05-23 DIAGNOSIS — M7712 Lateral epicondylitis, left elbow: Secondary | ICD-10-CM | POA: Diagnosis not present

## 2017-05-23 DIAGNOSIS — M19041 Primary osteoarthritis, right hand: Secondary | ICD-10-CM

## 2017-05-23 DIAGNOSIS — M7711 Lateral epicondylitis, right elbow: Secondary | ICD-10-CM

## 2017-05-23 DIAGNOSIS — Z8679 Personal history of other diseases of the circulatory system: Secondary | ICD-10-CM | POA: Diagnosis not present

## 2017-09-12 ENCOUNTER — Other Ambulatory Visit: Payer: Self-pay | Admitting: *Deleted

## 2017-09-12 MED ORDER — BACLOFEN 10 MG PO TABS: 10.0000 mg | ORAL_TABLET | Freq: Three times a day (TID) | ORAL | 0 refills | Status: DC

## 2017-09-12 NOTE — Telephone Encounter (Signed)
Refill request received via fax for Baclofen  Last Visit: 05/23/17 Next Visit: 10/23/17  Last Fill: 09/13/16  Okay to refill Baclofen?

## 2017-09-12 NOTE — Telephone Encounter (Signed)
ok 

## 2017-09-13 ENCOUNTER — Other Ambulatory Visit: Payer: Self-pay | Admitting: Rheumatology

## 2017-09-13 DIAGNOSIS — Z79899 Other long term (current) drug therapy: Secondary | ICD-10-CM

## 2017-09-13 NOTE — Telephone Encounter (Signed)
Last Visit: 05/23/17 Next Visit: 10/23/17 Labs: 01/22/17 cbc/cmp stable PLQ Eye Exam: 02/05/17 WNL   Patient advised she is due for labs. Patient states she will update 09/18/17 or 09/19/17.   Okay to refill 30 day supply per Dr. Corliss Skainseveshwar

## 2017-09-19 ENCOUNTER — Other Ambulatory Visit: Payer: Self-pay | Admitting: Rheumatology

## 2017-10-09 NOTE — Progress Notes (Signed)
Office Visit Note  Patient: Stephanie Myers             Date of Birth: 03-20-1965           MRN: [id number]             PCP: Loyal Jacobson, MD Referring: Loyal Jacobson, MD Visit Date: 10/23/2017 Occupation: @GUAROCC @  Subjective:  Lower back pain   History of Present Illness: Stephanie Myers is a 52 y.o. female ith history of SLE and osteoarthritis. She is on PLQ 200 mg BID M-F.  She denies any recent lupus flares.  She denies any sores in her mouth or nose.  She denies any sicca symptoms or symptoms of Raynolds.  Denies any shortness of breath or palpitations recently.  She denies any rashes but continues to have some sensitivity and wears sunscreen on a regular basis.  She feels as though her fatigue has been stable.  She denies any recent low-grade fevers or swollen lymph nodes.  She plans on getting her yearly influenza vaccine within the next couple weeks.  She presents today with lower back pain.  She states that the pain is more severe when she is standing or walking for prolonged periods of time.  She states that at times she has right-sided sciatica.  She is also having bilateral trochanteric bursitis she reports that her knee joints are doing well and denies any joint swelling.  She continues to have intermittent symptoms of bilateral lateral epicondylitis.  She denies any joint pain or joint swelling.   Activities of Daily Living:  Patient reports morning stiffness for 0 minutes.   Patient Reports nocturnal pain.  Difficulty dressing/grooming: Denies Difficulty climbing stairs: Denies Difficulty getting out of chair: Denies Difficulty using hands for taps, buttons, cutlery, and/or writing: Denies  Review of Systems  Constitutional: Negative for fatigue.  HENT: Negative for mouth sores, trouble swallowing, trouble swallowing, mouth dryness and nose dryness.   Eyes: Negative for pain, redness, visual disturbance and dryness.  Respiratory: Negative for cough, hemoptysis,  shortness of breath, wheezing and difficulty breathing.   Cardiovascular: Negative for chest pain, palpitations, hypertension and swelling in legs/feet.  Gastrointestinal: Positive for constipation and diarrhea. Negative for blood in stool, nausea and vomiting.       Will follow up with GI  Endocrine: Negative for increased urination.  Genitourinary: Negative for painful urination, nocturia and pelvic pain.  Musculoskeletal: Positive for arthralgias and joint pain. Negative for joint swelling, myalgias, muscle weakness, morning stiffness, muscle tenderness and myalgias.  Skin: Negative for color change, pallor, rash, hair loss, nodules/bumps, skin tightness, ulcers and sensitivity to sunlight.  Allergic/Immunologic: Negative for susceptible to infections.  Neurological: Negative for dizziness, light-headedness, numbness, headaches, memory loss and weakness.  Hematological: Negative for bruising/bleeding tendency and swollen glands.  Psychiatric/Behavioral: Negative for depressed mood, confusion and sleep disturbance. The patient is not nervous/anxious.     PMFS History:  Patient Active Problem List   Diagnosis Date Noted  . Essential hypertension 04/17/2016  . 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 High risk medication use 12/20/2015  . 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 Osteoarthritis of foot 12/20/2015  . Bilateral primary osteoarthritis of knee 12/20/2015    Past Medical History:  Diagnosis Date  . Anemia   . Diabetes mellitus without complication (HCC)   . Hyperlipidemia   . Hypertension   . Lupus (HCC)   . Shingles     Family History  Problem  Relation Age of Onset  . Healthy Daughter   . Healthy Son    Past Surgical History:  Procedure Laterality Date  . ABDOMINAL HYSTERECTOMY    . CESAREAN SECTION     Social History   Social History Narrative  . Not on file    Objective: Vital Signs: BP 104/71 (BP Location: Left  Arm, Patient Position: Sitting, Cuff Size: Normal)   Pulse 76   Resp 14   Ht 5\' 4"  (1.626 m)   Wt 200 lb 9.6 oz (91 kg)   BMI 34.43 kg/m    Physical Exam  Constitutional: She is oriented to person, place, and time. She appears well-developed and well-nourished.  HENT:  Head: Normocephalic and atraumatic.  Eyes: Conjunctivae and EOM are normal.  Neck: Normal range of motion.  Cardiovascular: Normal rate, regular rhythm, normal heart sounds and intact distal pulses.  Pulmonary/Chest: Effort normal and breath sounds normal.  Abdominal: Soft. Bowel sounds are normal.  Lymphadenopathy:    She has no cervical adenopathy.  Neurological: She is alert and oriented to person, place, and time.  Skin: Skin is warm and dry. Capillary refill takes less than 2 seconds.  Psychiatric: She has a normal mood and affect. Her behavior is normal.  Nursing note and vitals reviewed.    Musculoskeletal Exam: C-spine, thoracic spine, lumbar spine good range of motion.  No midline spinal tenderness.  Mild right SI joint tenderness.  Shoulder joints, elbow joints, wrist joints, MCPs, PIPs, DIPs good range of motion with no synovitis.  She has no tenderness on exam.  She is complete fist formation bilaterally.  Hip joints, knee joints, ankle joints, MTPs, PIPs, DIPs good range of motion with no synovitis.  No warmth or effusion of bilateral knee joints.  She has bilateral knee crepitus.  No tenderness or swelling of ankle joints.  No Achilles tendinitis or plantar fasciitis.  She has tenderness of bilateral trochanteric bursa.  CDAI Exam: CDAI Score: Not documented Patient Global Assessment: Not documented; Provider Global Assessment: Not documented Swollen: Not documented; Tender: Not documented Joint Exam   Not documented   There is currently no information documented on the homunculus. Go to the Rheumatology activity and complete the homunculus joint exam.  Investigation: No additional  findings.  Imaging: No results found.  Recent Labs: Lab Results  Component Value Date   WBC 3.4 (L) 01/22/2017   HGB 10.5 (L) 01/22/2017   PLT 258 01/22/2017   NA 137 01/22/2017   K 4.0 01/22/2017   CL 104 01/22/2017   CO2 26 01/22/2017   GLUCOSE 77 01/22/2017   BUN 19 01/22/2017   CREATININE 1.26 (H) 01/22/2017   BILITOT 0.3 01/22/2017   ALKPHOS 69 09/03/2016   AST 22 01/22/2017   ALT 27 01/22/2017   PROT 8.3 (H) 01/22/2017   ALBUMIN 4.5 09/03/2016   CALCIUM 9.9 01/22/2017   GFRAA 58 (L) 01/22/2017    Speciality Comments: PLQ Eye Exam: 02/05/17 WNL @ [facility] follow up in 6 months   Procedures:  No procedures performed Allergies: Lisinopril   Assessment / Plan:     Visit Diagnoses: Other organ or system involvement in systemic lupus erythematosus (HCC) - +ANA, +ds DNA, + Smith, RNP and Ro: She has not had any recent lupus flares. She is clinically doing well on Plaquenil 200 mg 1 tablet by mouth twice daily Monday through Friday.  She has no synovitis on exam.  She has been having increased lower back pain that is tender in  the right SI joint.  She has not had any joint swelling recently.  She has no oral nasal ulcerations noted on exam.  She has not had any sicca symptoms and no parotid swelling was noted.  She has not had any recent symptoms of Raynaud's and no digital ulcerations were noted.  No malar rash or skin lesions were noted.  She has not had any shortness of breath or palpitations recently.  She has not had any hair loss but continues to have some sensitivity.  She was sunscreen on a regular basis.  We discussed wearing sunscreen on a daily basis to prevent flares.  We will check autoimmune labs today.  She will continue on Plaquenil 200 mg 1 tablet by mouth twice daily.  She does not need any refills at this time.  She is advised to notify us if she develops new or worsening symptoms.- Plan: CBC with Differential/Platelet, COMPLETE METABOLIC PANEL WITH GFR,  Urinalysis, Routine w reflex microscopic, Anti-DNA antibody, double-stranded, C3 and C4, Sedimentation rate, ANA  High risk medication use - Plaquenil 200 mg twice daily Monday through Friday.  CBC and CMP will be drawn today to monitor for drug toxicity.- Plan: CBC with Differential/Platelet, COMPLETE METABOLIC PANEL WITH GFR  Primary osteoarthritis of both hands: She has no discomfort in her hands at this time.  No synovitis was noted.  She is complete fist formation bilaterally.  Joint protection and muscle strengthening were discussed.  Primary osteoarthritis of both feet: She has no discomfort in her feet at this time.  She wears proper fitting shoes.  Bilateral primary osteoarthritis of knee: No warmth or effusion noted.  She has good range of motion with no discomfort.  She has bilateral knee crepitus.  Lateral epicondylitis, left elbow: She has intermittent tenderness.  She continues to perform stretching exercises on a regular basis.  Lateral epicondylitis, right elbow: She has intermittent symptoms of right lateral epicondylitis.  She perform stretching exercises on a regular basis.  Trochanteric bursitis of both hips: She has tenderness of bilateral trochanteric bursa on exam.  She declined a cortisone injection today in the office.  She was given a handout of exercises that she can perform at home.  We also discussed physical therapy in the future if her symptoms persist.   Chronic right SI joint pain: She has mild right SI joint tenderness on exam today.  She has been having episodes of right-sided sciatica.  She has been having increased lower back pain especially when standing or walking for prolonged periods of time.  She has good range of motion of her lumbar spine on exam.  A x-ray of the pelvis was obtained today.  Myalgia: She does not have myalgias at this time.   History of hypertension  Urticaria   Orders: Orders Placed This Encounter  Procedures  . XR Pelvis 1-2  Views  . XR Lumbar Spine 2-3 Views  . CBC with Differential/Platelet  . COMPLETE METABOLIC PANEL WITH GFR  . Urinalysis, Routine w reflex microscopic  . Anti-DNA antibody, double-stranded  . C3 and C4  . Sedimentation rate  . ANA   No orders of the defined types were placed in this encounter.   Face-to-face time spent with patient was 30 minutes. Greater than 50% of time was spent in counseling and coordination of care.  Follow-Up Instructions: Return in about 5 months (around 03/24/2018) for Systemic lupus erythematosus, Osteoarthritis.   Gearldine Bienenstock, PA-C  Note - This record has been created  using Editor, commissioning.  Chart creation errors have been sought, but may not always  have been located. Such creation errors do not reflect on  the standard of medical care.

## 2017-10-16 ENCOUNTER — Other Ambulatory Visit: Payer: Self-pay | Admitting: Rheumatology

## 2017-10-16 NOTE — Telephone Encounter (Signed)
Patient states she just picked up a refill on her medication. Patient states she does not need a refill of her medication at this time. Patient advised she is due for labs. Patient will update labs at appointment on 10/23/17. Patient states she thought she had labs at her last appointment on 05/23/17. I advised patient I would check with our lab tech French Ana to see if labs had been preformed and not crossed. French Ana has checked in her system and there were no labs performed on that day.

## 2017-10-23 ENCOUNTER — Ambulatory Visit (INDEPENDENT_AMBULATORY_CARE_PROVIDER_SITE_OTHER): Payer: Self-pay

## 2017-10-23 ENCOUNTER — Ambulatory Visit: Payer: BLUE CROSS/BLUE SHIELD | Admitting: Physician Assistant

## 2017-10-23 ENCOUNTER — Encounter: Payer: Self-pay | Admitting: Physician Assistant

## 2017-10-23 VITALS — BP 104/71 | HR 76 | Resp 14 | Ht 64.0 in | Wt 200.6 lb

## 2017-10-23 DIAGNOSIS — M7062 Trochanteric bursitis, left hip: Secondary | ICD-10-CM

## 2017-10-23 DIAGNOSIS — M533 Sacrococcygeal disorders, not elsewhere classified: Secondary | ICD-10-CM | POA: Diagnosis not present

## 2017-10-23 DIAGNOSIS — M7711 Lateral epicondylitis, right elbow: Secondary | ICD-10-CM

## 2017-10-23 DIAGNOSIS — Z79899 Other long term (current) drug therapy: Secondary | ICD-10-CM

## 2017-10-23 DIAGNOSIS — M7061 Trochanteric bursitis, right hip: Secondary | ICD-10-CM

## 2017-10-23 DIAGNOSIS — M3219 Other organ or system involvement in systemic lupus erythematosus: Secondary | ICD-10-CM

## 2017-10-23 DIAGNOSIS — M19042 Primary osteoarthritis, left hand: Secondary | ICD-10-CM

## 2017-10-23 DIAGNOSIS — M19071 Primary osteoarthritis, right ankle and foot: Secondary | ICD-10-CM

## 2017-10-23 DIAGNOSIS — G8929 Other chronic pain: Secondary | ICD-10-CM

## 2017-10-23 DIAGNOSIS — Z8679 Personal history of other diseases of the circulatory system: Secondary | ICD-10-CM

## 2017-10-23 DIAGNOSIS — M19041 Primary osteoarthritis, right hand: Secondary | ICD-10-CM

## 2017-10-23 DIAGNOSIS — M170 Bilateral primary osteoarthritis of knee: Secondary | ICD-10-CM

## 2017-10-23 DIAGNOSIS — M5441 Lumbago with sciatica, right side: Secondary | ICD-10-CM | POA: Diagnosis not present

## 2017-10-23 DIAGNOSIS — M7712 Lateral epicondylitis, left elbow: Secondary | ICD-10-CM

## 2017-10-23 DIAGNOSIS — M19072 Primary osteoarthritis, left ankle and foot: Secondary | ICD-10-CM

## 2017-10-23 DIAGNOSIS — L509 Urticaria, unspecified: Secondary | ICD-10-CM

## 2017-10-23 DIAGNOSIS — M791 Myalgia, unspecified site: Secondary | ICD-10-CM

## 2017-10-23 NOTE — Patient Instructions (Addendum)
Trochanteric Bursitis Rehab Ask your health care provider which exercises are safe for you. Do exercises exactly as told by your health care provider and adjust them as directed. It is normal to feel mild stretching, pulling, tightness, or discomfort as you do these exercises, but you should stop right away if you feel sudden pain or your pain gets worse.Do not begin these exercises until told by your health care provider. Stretching exercises These exercises warm up your muscles and joints and improve the movement and flexibility of your hip. These exercises also help to relieve pain and stiffness. Exercise A: Iliotibial band stretch  1. Lie on your side with your left / right leg in the top position. 2. Bend your left / right knee and grab your ankle. 3. Slowly bring your knee back so your thigh is behind your body. 4. Slowly lower your knee toward the floor until you feel a gentle stretch on the outside of your left / right thigh. If you do not feel a stretch and your knee will not fall farther, place the heel of your other foot on top of your outer knee and pull your thigh down farther. 5. Hold this position for __________ seconds. 6. Slowly return to the starting position. Repeat __________ times. Complete this exercise __________ times a day. Strengthening exercises These exercises build strength and endurance in your hip and pelvis. Endurance is the ability to use your muscles for a long time, even after they get tired. Exercise B: Bridge ( hip extensors) 1. Lie on your back on a firm surface with your knees bent and your feet flat on the floor. 2. Tighten your buttocks muscles and lift your buttocks off the floor until your trunk is level with your thighs. You should feel the muscles working in your buttocks and the back of your thighs. If this exercise is too easy, try doing it with your arms crossed over your chest. 3. Hold this position for __________ seconds. 4. Slowly return to the  starting position. 5. Let your muscles relax completely between repetitions. Repeat __________ times. Complete this exercise __________ times a day. Exercise C: Squats ( knee extensors and  quadriceps) 1. Stand in front of a table, with your feet and knees pointing straight ahead. You may rest your hands on the table for balance but not for support. 2. Slowly bend your knees and lower your hips like you are going to sit in a chair. ? Keep your weight over your heels, not over your toes. ? Keep your lower legs upright so they are parallel with the table legs. ? Do not let your hips go lower than your knees. ? Do not bend lower than told by your health care provider. ? If your hip pain increases, do not bend as low. 3. Hold this position for __________ seconds. 4. Slowly push with your legs to return to standing. Do not use your hands to pull yourself to standing. Repeat __________ times. Complete this exercise __________ times a day. Exercise D: Hip hike 1. Stand sideways on a bottom step. Stand on your left / right leg with your other foot unsupported next to the step. You can hold onto the railing or wall if needed for balance. 2. Keeping your knees straight and your torso square, lift your left / right hip up toward the ceiling. 3. Hold this position for __________ seconds. 4. Slowly let your left / right hip lower toward the floor, past the starting position. Your foot   should get closer to the floor. Do not lean or bend your knees. Repeat __________ times. Complete this exercise __________ times a day. Exercise E: Single leg stand 1. Stand near a counter or door frame that you can hold onto for balance as needed. It is helpful to stand in front of a mirror for this exercise so you can watch your hip. 2. Squeeze your left / right buttock muscles then lift up your other foot. Do not let your left / right hip push out to the side. 3. Hold this position for __________ seconds. Repeat  __________ times. Complete this exercise __________ times a day. This information is not intended to replace advice given to you by your health care provider. Make sure you discuss any questions you have with your health care provider. Document Released: 02/09/2004 Document Revi Back Exercises The following exercises strengthen the muscles that help to support the back. They also help to keep the lower back flexible. Doing these exercises can help to prevent back pain or lessen existing pain. If you have back pain or discomfort, try doing these exercises 2-3 times each day or as told by your health care provider. When the pain goes away, do them once each day, but increase the number of times that you repeat the steps for each exercise (do more repetitions). If you do not have back pain or discomfort, do these exercises once each day or as told by your health care provider. Exercises Single Knee to Chest  Repeat these steps 3-5 times for each leg: 1. Lie on your back on a firm bed or the floor with your legs extended. 2. Bring one knee to your chest. Your other leg should stay extended and in contact with the floor. 3. Hold your knee in place by grabbing your knee or thigh. 4. Pull on your knee until you feel a gentle stretch in your lower back. 5. Hold the stretch for 10-30 seconds. 6. Slowly release and straighten your leg.  Pelvic Tilt  Repeat these steps 5-10 times: 1. Lie on your back on a firm bed or the floor with your legs extended. 2. Bend your knees so they are pointing toward the ceiling and your feet are flat on the floor. 3. Tighten your lower abdominal muscles to press your lower back against the floor. This motion will tilt your pelvis so your tailbone points up toward the ceiling instead of pointing to your feet or the floor. 4. With gentle tension and even breathing, hold this position for 5-10 seconds.  Cat-Cow  Repeat these steps until your lower back becomes more  flexible: 1. Get into a hands-and-knees position on a firm surface. Keep your hands under your shoulders, and keep your knees under your hips. You may place padding under your knees for comfort. 2. Let your head hang down, and point your tailbone toward the floor so your lower back becomes rounded like the back of a cat. 3. Hold this position for 5 seconds. 4. Slowly lift your head and point your tailbone up toward the ceiling so your back forms a sagging arch like the back of a cow. 5. Hold this position for 5 seconds.  Press-Ups  Repeat these steps 5-10 times: 1. Lie on your abdomen (face-down) on the floor. 2. Place your palms near your head, about shoulder-width apart. 3. While you keep your back as relaxed as possible and keep your hips on the floor, slowly straighten your arms to raise the top half  of your body and lift your shoulders. Do not use your back muscles to raise your upper torso. You may adjust the placement of your hands to make yourself more comfortable. 4. Hold this position for 5 seconds while you keep your back relaxed. 5. Slowly return to lying flat on the floor.  Bridges  Repeat these steps 10 times: 1. Lie on your back on a firm surface. 2. Bend your knees so they are pointing toward the ceiling and your feet are flat on the floor. 3. Tighten your buttocks muscles and lift your buttocks off of the floor until your waist is at almost the same height as your knees. You should feel the muscles working in your buttocks and the back of your thighs. If you do not feel these muscles, slide your feet 1-2 inches farther away from your buttocks. 4. Hold this position for 3-5 seconds. 5. Slowly lower your hips to the starting position, and allow your buttocks muscles to relax completely.  If this exercise is too easy, try doing it with your arms crossed over your chest. Abdominal Crunches  Repeat these steps 5-10 times: 1. Lie on your back on a firm bed or the floor with  your legs extended. 2. Bend your knees so they are pointing toward the ceiling and your feet are flat on the floor. 3. Cross your arms over your chest. 4. Tip your chin slightly toward your chest without bending your neck. 5. Tighten your abdominal muscles and slowly raise your trunk (torso) high enough to lift your shoulder blades a tiny bit off of the floor. Avoid raising your torso higher than that, because it can put too much stress on your low back and it does not help to strengthen your abdominal muscles. 6. Slowly return to your starting position.  Back Lifts Repeat these steps 5-10 times: 1. Lie on your abdomen (face-down) with your arms at your sides, and rest your forehead on the floor. 2. Tighten the muscles in your legs and your buttocks. 3. Slowly lift your chest off of the floor while you keep your hips pressed to the floor. Keep the back of your head in line with the curve in your back. Your eyes should be looking at the floor. 4. Hold this position for 3-5 seconds. 5. Slowly return to your starting position.  Contact a health care provider if:  Your back pain or discomfort gets much worse when you do an exercise.  Your back pain or discomfort does not lessen within 2 hours after you exercise. If you have any of these problems, stop doing these exercises right away. Do not do them again unless your health care provider says that you can. Get help right away if:  You develop sudden, severe back pain. If this happens, stop doing the exercises right away. Do not do them again unless your health care provider says that you can. This information is not intended to replace advice given to you by your health care provider. Make sure you discuss any questions you have with your health care provider. Document Released: 02/09/2004 Document Revised: 05/11/2015 Document Reviewed: 02/25/2014 Elsevier Interactive Patient Education  2017 [employer] sed: 09/08/2015 Document Reviewed:  12/17/2014 Elsevier Interactive Patient Education  [employer].

## 2017-10-24 NOTE — Progress Notes (Signed)
CBC reveals findings consistent with anemia.  WBC count is low.  Creatinine is elevated and GFR is low.  Please ask if patient sees a nephrologist.  If she does not please refer patient for further evaluation due to history of lupus.  UA was WNL.  Complements and sed rate normal. DsDNA is pending.  Please advise patient to return in 1 month to recheck CBC and CMP.

## 2017-10-25 LAB — URINALYSIS, ROUTINE W REFLEX MICROSCOPIC
Bilirubin Urine: NEGATIVE
Glucose, UA: NEGATIVE
Hgb urine dipstick: NEGATIVE
Ketones, ur: NEGATIVE
LEUKOCYTES UA: NEGATIVE
Nitrite: NEGATIVE
PROTEIN: NEGATIVE
Specific Gravity, Urine: 1.008 (ref 1.001–1.03)
pH: 5.5 (ref 5.0–8.0)

## 2017-10-25 LAB — COMPLETE METABOLIC PANEL WITH GFR
AG Ratio: 1.2 (calc) (ref 1.0–2.5)
ALBUMIN MSPROF: 4.4 g/dL (ref 3.6–5.1)
ALT: 18 U/L (ref 6–29)
AST: 18 U/L (ref 10–35)
Alkaline phosphatase (APISO): 69 U/L (ref 33–130)
BUN / CREAT RATIO: 15 (calc) (ref 6–22)
BUN: 22 mg/dL (ref 7–25)
CALCIUM: 9.9 mg/dL (ref 8.6–10.4)
CO2: 27 mmol/L (ref 20–32)
CREATININE: 1.49 mg/dL — AB (ref 0.50–1.05)
Chloride: 101 mmol/L (ref 98–110)
GFR, EST NON AFRICAN AMERICAN: 40 mL/min/{1.73_m2} — AB (ref >=60)
GFR, Est African American: 47 mL/min/{1.73_m2} — ABNORMAL LOW (ref >=60)
GLOBULIN: 3.6 g/dL (ref 1.9–3.7)
Glucose, Bld: 87 mg/dL (ref 65–99)
Potassium: 3.9 mmol/L (ref 3.5–5.3)
SODIUM: 136 mmol/L (ref 135–146)
Total Bilirubin: 0.4 mg/dL (ref 0.2–1.2)
Total Protein: 8 g/dL (ref 6.1–8.1)

## 2017-10-25 LAB — CBC WITH DIFFERENTIAL/PLATELET
BASOS ABS: 11 {cells}/uL (ref 0–200)
Basophils Relative: 0.4 %
EOS PCT: 1.8 %
Eosinophils Absolute: 50 cells/uL (ref 15–500)
HEMATOCRIT: 35.1 % (ref 35.0–45.0)
Hemoglobin: 10.6 g/dL — ABNORMAL LOW (ref 11.7–15.5)
LYMPHS ABS: 700 {cells}/uL — AB (ref 850–3900)
MCH: 22.7 pg — ABNORMAL LOW (ref 27.0–33.0)
MCHC: 30.2 g/dL — AB (ref 32.0–36.0)
MCV: 75.2 fL — ABNORMAL LOW (ref 80.0–100.0)
MPV: 11.2 fL (ref 7.5–12.5)
Monocytes Relative: 7.5 %
NEUTROS ABS: 1828 {cells}/uL (ref 1500–7800)
Neutrophils Relative %: 65.3 %
Platelets: 266 10*3/uL (ref 140–400)
RBC: 4.67 10*6/uL (ref 3.80–5.10)
RDW: 15.3 % — ABNORMAL HIGH (ref 11.0–15.0)
Total Lymphocyte: 25 %
WBC mixed population: 210 cells/uL (ref 200–950)
WBC: 2.8 10*3/uL — ABNORMAL LOW (ref 3.8–10.8)

## 2017-10-25 LAB — ANTI-NUCLEAR AB-TITER (ANA TITER)

## 2017-10-25 LAB — ANA: ANA: POSITIVE — AB

## 2017-10-25 LAB — SEDIMENTATION RATE: SED RATE: 9 mm/h (ref 0–30)

## 2017-10-25 LAB — ANTI-DNA ANTIBODY, DOUBLE-STRANDED: DS DNA AB: 16 [IU]/mL — AB

## 2017-10-25 LAB — C3 AND C4
C3 Complement: 125 mg/dL (ref 83–193)
C4 COMPLEMENT: 56 mg/dL (ref 15–57)

## 2017-10-25 NOTE — Progress Notes (Signed)
DsDNA is elevated but improved from previous labs.  She was not experiencing signs or symptoms of a flare at her recent visit.  Please add a future order for dsDNA  to be rechecked with labs in 1 month.

## 2017-11-01 ENCOUNTER — Encounter: Payer: Self-pay | Admitting: *Deleted

## 2017-11-01 ENCOUNTER — Telehealth: Payer: Self-pay | Admitting: *Deleted

## 2017-11-01 DIAGNOSIS — R944 Abnormal results of kidney function studies: Secondary | ICD-10-CM

## 2017-11-01 DIAGNOSIS — R748 Abnormal levels of other serum enzymes: Secondary | ICD-10-CM

## 2017-11-01 DIAGNOSIS — R7989 Other specified abnormal findings of blood chemistry: Secondary | ICD-10-CM

## 2017-11-01 DIAGNOSIS — M3219 Other organ or system involvement in systemic lupus erythematosus: Secondary | ICD-10-CM

## 2017-11-01 DIAGNOSIS — Z79899 Other long term (current) drug therapy: Secondary | ICD-10-CM

## 2017-11-01 NOTE — Telephone Encounter (Signed)
Patient advised CBC reveals findings consistent with anemia. WBC count is low. Creatinine is elevated and GFR is low. Patient states she does not see nephrology. Patient advised we will place referral. UA was WNL. Complements and sed rate normal. DsDNA is pending. patient advised to return in 1 month to recheck CBC and CMP. DsDNA is elevated but improved from previous labs. Patient verbalized understanding.

## 2017-11-01 NOTE — Telephone Encounter (Signed)
-----   Message from Gearldine Bienenstock, PA-C sent at 10/25/2017 12:17 PM EDT ----- DsDNA is elevated but improved from previous labs.  She was not experiencing signs or symptoms of a flare at her recent visit.  Please add a future order for dsDNA  to be rechecked with labs in 1 month.

## 2017-11-25 ENCOUNTER — Other Ambulatory Visit: Payer: Self-pay | Admitting: Rheumatology

## 2017-11-25 NOTE — Telephone Encounter (Signed)
Last visit: 10/23/17 Next Visit: 03/24/18 Labs: 10/23/17 CBC reveals findings consistent with anemia. WBC count is low. Creatinine is elevated and GFR is low. PLQ Eye Exam: 02/05/17 WNL   Okay to refill per Dr. Corliss Skains

## 2017-11-26 ENCOUNTER — Telehealth: Payer: Self-pay | Admitting: Rheumatology

## 2017-11-26 NOTE — Telephone Encounter (Signed)
Patient left a voicemail requesting prescription refill of Plaquenil.  Patient states she took her last pills today.

## 2017-11-26 NOTE — Telephone Encounter (Signed)
Patient advised prescription was sent to the pharmacy on 11/26/17. Patient asked about nephrology referral. Patient advised referral placed and notes faxed. Patient provided with number to Dr. Deterding's office to follow up.

## 2018-02-05 ENCOUNTER — Telehealth: Payer: Self-pay | Admitting: Rheumatology

## 2018-02-05 NOTE — Telephone Encounter (Signed)
Patient called stating she has an appointment with Dr. Carolee Rota at [facility] on 02/21/18.  Patient is checking if she needs a plaquenil eye exam at that appointment.  Patient requested a return call.

## 2018-02-05 NOTE — Telephone Encounter (Signed)
Patient advised she will be due to update her PLQ eye exam. Will fax form at to eye doctor to have them fill out for patient.

## 2018-02-05 NOTE — Telephone Encounter (Signed)
Opened in error

## 2018-02-06 ENCOUNTER — Telehealth: Payer: Self-pay | Admitting: Rheumatology

## 2018-02-06 NOTE — Telephone Encounter (Signed)
Faxed PLQ eye exam form to eye doctor.

## 2018-02-06 NOTE — Telephone Encounter (Signed)
Patient called stating she was returning your call with Dr. Orlene Erm fax # [phone]

## 2018-02-19 ENCOUNTER — Telehealth: Payer: Self-pay | Admitting: Rheumatology

## 2018-02-19 NOTE — Telephone Encounter (Signed)
Patient request a Plaquenil Eye Exam form be sent to [facility] doctor. Patient has appt this Friday 02/21/18. Dr. Josepha Pigg FAX# [phone]

## 2018-02-19 NOTE — Telephone Encounter (Signed)
Left message to advise patient a PLQ eye exam form has already been sent to eye doctor.

## 2018-03-10 NOTE — Progress Notes (Signed)
Office Visit Note  Patient: Stephanie FindersKaren Y Myers             Date of Birth: 04/24/1965           MRN: [id number]             PCP: Loyal JacobsonKalish, Michael, MD Referring: Loyal JacobsonKalish, Michael, MD Visit Date: 03/24/2018 Occupation: @GUAROCC @  Subjective:  Medication monitoring   History of Present Illness: Stephanie Myers is a 53 y.o. female with history of systemic lupus erythematosus and osteoarthritis.  She is taking PLQ 200 mg BID M-F.  She denies missing any doses of Plaquenil recently.  She has been tolerating it well.  She denies any recent signs or symptoms of a lupus flare.  She denies any recent infections.  She denies any fatigue, fevers, or swollen lymph nodes recently.  She denies any recent rashes or photosensitivity.  She continues to wear sunscreen on a daily basis.  She denies any hair loss recently.  She denies any symptoms of Raynaud's or digital ulcerations.  She denies any joint pain or joint swelling at this time.  She denies any morning stiffness in her joints.  She denies any sores in her mouth or nose.  She denies any sicca symptoms.  She denies any palpitations or shortness of breath recently.  She continues to take a vitamin D supplement on a daily basis.  She has no concerns at this time.    Activities of Daily Living:  Patient reports morning stiffness for 0 minutes.   Patient Denies nocturnal pain.  Difficulty dressing/grooming: Denies Difficulty climbing stairs: Denies Difficulty getting out of chair: Denies Difficulty using hands for taps, buttons, cutlery, and/or writing: Denies  Review of Systems  Constitutional: Negative for fatigue.  HENT: Negative for mouth sores, mouth dryness and nose dryness.   Eyes: Negative for pain, visual disturbance and dryness.  Respiratory: Negative for cough, hemoptysis, shortness of breath and difficulty breathing.   Cardiovascular: Negative for chest pain, palpitations, hypertension and swelling in legs/feet.  Gastrointestinal: Negative for  blood in stool, constipation and diarrhea.  Endocrine: Negative for increased urination.  Genitourinary: Negative for painful urination.  Musculoskeletal: Negative for arthralgias, joint pain, joint swelling, myalgias, muscle weakness, morning stiffness, muscle tenderness and myalgias.  Skin: Negative for color change, pallor, rash, hair loss, nodules/bumps, skin tightness, ulcers and sensitivity to sunlight.  Allergic/Immunologic: Negative for susceptible to infections.  Neurological: Negative for dizziness, numbness, headaches and weakness.  Hematological: Negative for swollen glands.  Psychiatric/Behavioral: Negative for depressed mood and sleep disturbance. The patient is not nervous/anxious.     PMFS History:  Patient Active Problem List   Diagnosis Date Noted  . Essential hypertension 04/17/2016  . 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 High risk medication use 12/20/2015  . 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 Osteoarthritis of foot 12/20/2015  . Bilateral primary osteoarthritis of knee 12/20/2015    Past Medical History:  Diagnosis Date  . Anemia   . Diabetes mellitus without complication (HCC)   . Hyperlipidemia   . Hypertension   . Lupus (HCC)   . Shingles     Family History  Problem Relation Age of Onset  . Healthy Daughter   . Healthy Son    Past Surgical History:  Procedure Laterality Date  . ABDOMINAL HYSTERECTOMY    . CESAREAN SECTION     Social History   Social History Narrative  . Not on file    There is  no immunization history on file for this patient.   Objective: Vital Signs: BP 110/78 (BP Location: Left Arm, Patient Position: Sitting, Cuff Size: Large)   Pulse 88   Resp 14   Ht 5\' 4"  (1.626 m)   Wt 207 lb (93.9 kg)   BMI 35.53 kg/m    Physical Exam Vitals signs and nursing note reviewed.  Constitutional:      Appearance: She is well-developed.  HENT:     Head: Normocephalic and atraumatic.       Comments: No parotid swelling or tenderness.    Nose:     Comments: No nasal ulcerations    Mouth/Throat:     Comments: No oral ulcerations Eyes:     Conjunctiva/sclera: Conjunctivae normal.  Neck:     Musculoskeletal: Normal range of motion.  Cardiovascular:     Rate and Rhythm: Normal rate and regular rhythm.     Heart sounds: Normal heart sounds.  Pulmonary:     Effort: Pulmonary effort is normal.     Breath sounds: Normal breath sounds.  Abdominal:     General: Bowel sounds are normal.     Palpations: Abdomen is soft.  Lymphadenopathy:     Cervical: No cervical adenopathy.  Skin:    General: Skin is warm and dry.     Capillary Refill: Capillary refill takes less than 2 seconds.     Comments: No malar rash.  No digital ulcerations or signs of gangrene.  Neurological:     Mental Status: She is alert and oriented to person, place, and time.  Psychiatric:        Behavior: Behavior normal.      Musculoskeletal Exam: C-spine, thoracic spine, spine good range of motion.  No midline spinal tenderness.  No SI joint tenderness.  Shoulder joints, elbow joints, wrist joints, MCPs, PIPs, DIPs good range of motion with no synovitis.  She has complete fist formation bilaterally.  Hip joints, knee joints, ankle joints, MCPs, PIPs, DIPs good range of motion with no synovitis.  No warmth or effusion bilateral knee joints.  No tender swelling of ankle joints.  No tenderness over trochanteric bursa bilaterally.  CDAI Exam: CDAI Score: Not documented Patient Global Assessment: Not documented; Provider Global Assessment: Not documented Swollen: Not documented; Tender: Not documented Joint Exam   Not documented   There is currently no information documented on the homunculus. Go to the Rheumatology activity and complete the homunculus joint exam.  Investigation: No additional findings.  Imaging: No results found.  Recent Labs: Lab Results  Component Value Date   WBC 2.8 (L)  10/23/2017   HGB 10.6 (L) 10/23/2017   PLT 266 10/23/2017   NA 136 10/23/2017   K 3.9 10/23/2017   CL 101 10/23/2017   CO2 27 10/23/2017   GLUCOSE 87 10/23/2017   BUN 22 10/23/2017   CREATININE 1.49 (H) 10/23/2017   BILITOT 0.4 10/23/2017   ALKPHOS 69 09/03/2016   AST 18 10/23/2017   ALT 18 10/23/2017   PROT 8.0 10/23/2017   ALBUMIN 4.5 09/03/2016   CALCIUM 9.9 10/23/2017   GFRAA 47 (L) 10/23/2017    Speciality Comments: PLQ Eye Exam: 02/21/18 WNL @ [facility] follow up in 6 months   Procedures:  No procedures performed Allergies: Lisinopril   Assessment / Plan:     Visit Diagnoses: Other organ or system involvement in systemic lupus erythematosus (HCC) - +ANA, +ds DNA, + Smith, RNP and Ro: She has not had any signs or symptoms of  a lupus flare recently.  She is clinically doing well on Plaquenil 200 mg twice daily Monday through Friday.  She has been tolerating Plaquenil and has not missed any doses recently.  She has no joint pain or synovitis at this time.  No Maller rash or skin lesions were noted.  She continues to wear sunscreen on a daily basis.  She has not had any photosensitivity recently.  No hair loss or signs of alopecia.  She denied any symptoms of Raynaud's and no digital ulcerations or signs of gangrene were noted.  She is not experiencing any sicca symptoms and no parotid swelling or tenderness was noted.  No palpitations or shortness of breath.  Lungs were clear to auscultation.  She has not had any fatigue, low-grade fevers, or cervical lymphadenopathy.  She will continue on Plaquenil 200 mg twice daily Monday through Friday.  She does not need any refills at this time.  We will check autoimmune lab work today.  She was advised to notify us if she develops new or worsening symptoms.  She will follow-up in the office in 5 months.- Plan: Urinalysis, Routine w reflex microscopic, Anti-DNA antibody, double-stranded, C3 and C4, VITAMIN D 25 Hydroxy (Vit-D Deficiency,  Fractures), Sedimentation rate  High risk medication use - Plaquenil 200 mg twice daily Monday through Friday. eye exam: 02/21/2018.   Last CBC/CMP showed anemia and low WBC as well as elevated creatinine on 10/23/2017.  She was referred to nephrology.  Due for CBC/CMP today and will monitor every 5 months.  - Plan: CBC with Differential/Platelet, COMPLETE METABOLIC PANEL WITH GFR  Primary osteoarthritis of both hands: She has no tenderness or synovitis on exam.  She has complete fist formation bilaterally.  She has no discomfort at this time.  Joint protection and muscle strengthening were discussed.   Bilateral primary osteoarthritis of knee: She has no warmth or effusion.  Good ROM bilaterally.    Primary osteoarthritis of both feet:She has no discomfort at this time.  She wears proper fitting shoes.   Lateral epicondylitis, left elbow: Resolved.   Lateral epicondylitis, right elbow: Resolved.   Trochanteric bursitis of both hips: Resolved.  She has no tenderness over trochanteric bursa at this time.   Myalgia: She has no myalgias or muscle tenderness.  No muscle weakness.   Urticaria: She has not had any recent rashes or urticaria.   History of hypertension: Well controlled. BP is 110/78  Orders: Orders Placed This Encounter  Procedures  . CBC with Differential/Platelet  . COMPLETE METABOLIC PANEL WITH GFR  . Urinalysis, Routine w reflex microscopic  . Anti-DNA antibody, double-stranded  . C3 and C4  . VITAMIN D 25 Hydroxy (Vit-D Deficiency, Fractures)  . Sedimentation rate   No orders of the defined types were placed in this encounter.     Follow-Up Instructions: Return in about 5 months (around 08/24/2018) for Systemic lupus erythematosus, Osteoarthritis.   Gearldine Bienenstock, PA-C  Note - This record has been created using Dragon software.  Chart creation errors have been sought, but may not always  have been located. Such creation errors do not reflect on  the standard of  medical care.

## 2018-03-14 ENCOUNTER — Other Ambulatory Visit: Payer: Self-pay | Admitting: Rheumatology

## 2018-03-17 NOTE — Telephone Encounter (Signed)
Last visit: 10/23/17 Next Visit: 03/24/18 Labs: 10/23/17 CBC reveals findings consistent with anemia. WBC count is low. Creatinine is elevated and GFR is low. PLQ Eye Exam: 02/21/18 WNL   Okay to refill per Dr. Corliss Skains

## 2018-03-24 ENCOUNTER — Encounter (INDEPENDENT_AMBULATORY_CARE_PROVIDER_SITE_OTHER): Payer: Self-pay

## 2018-03-24 ENCOUNTER — Encounter: Payer: Self-pay | Admitting: Physician Assistant

## 2018-03-24 ENCOUNTER — Ambulatory Visit: Payer: BLUE CROSS/BLUE SHIELD | Admitting: Physician Assistant

## 2018-03-24 VITALS — BP 110/78 | HR 88 | Resp 14 | Ht 64.0 in | Wt 207.0 lb

## 2018-03-24 DIAGNOSIS — L509 Urticaria, unspecified: Secondary | ICD-10-CM

## 2018-03-24 DIAGNOSIS — Z79899 Other long term (current) drug therapy: Secondary | ICD-10-CM

## 2018-03-24 DIAGNOSIS — M7061 Trochanteric bursitis, right hip: Secondary | ICD-10-CM

## 2018-03-24 DIAGNOSIS — M19072 Primary osteoarthritis, left ankle and foot: Secondary | ICD-10-CM

## 2018-03-24 DIAGNOSIS — M3219 Other organ or system involvement in systemic lupus erythematosus: Secondary | ICD-10-CM | POA: Diagnosis not present

## 2018-03-24 DIAGNOSIS — Z8679 Personal history of other diseases of the circulatory system: Secondary | ICD-10-CM

## 2018-03-24 DIAGNOSIS — M19041 Primary osteoarthritis, right hand: Secondary | ICD-10-CM

## 2018-03-24 DIAGNOSIS — M791 Myalgia, unspecified site: Secondary | ICD-10-CM

## 2018-03-24 DIAGNOSIS — M170 Bilateral primary osteoarthritis of knee: Secondary | ICD-10-CM | POA: Diagnosis not present

## 2018-03-24 DIAGNOSIS — M7712 Lateral epicondylitis, left elbow: Secondary | ICD-10-CM

## 2018-03-24 DIAGNOSIS — M19071 Primary osteoarthritis, right ankle and foot: Secondary | ICD-10-CM

## 2018-03-24 DIAGNOSIS — M19042 Primary osteoarthritis, left hand: Secondary | ICD-10-CM

## 2018-03-24 DIAGNOSIS — M7711 Lateral epicondylitis, right elbow: Secondary | ICD-10-CM

## 2018-03-24 DIAGNOSIS — M7062 Trochanteric bursitis, left hip: Secondary | ICD-10-CM

## 2018-03-26 LAB — CBC WITH DIFFERENTIAL/PLATELET
ABSOLUTE MONOCYTES: 389 {cells}/uL (ref 200–950)
BASOS ABS: 22 {cells}/uL (ref 0–200)
Basophils Relative: 0.6 %
EOS ABS: 61 {cells}/uL (ref 15–500)
Eosinophils Relative: 1.7 %
HEMATOCRIT: 33.7 % — AB (ref 35.0–45.0)
Hemoglobin: 10.4 g/dL — ABNORMAL LOW (ref 11.7–15.5)
LYMPHS ABS: 824 {cells}/uL — AB (ref 850–3900)
MCH: 23.2 pg — AB (ref 27.0–33.0)
MCHC: 30.9 g/dL — AB (ref 32.0–36.0)
MCV: 75.2 fL — AB (ref 80.0–100.0)
MPV: 11.4 fL (ref 7.5–12.5)
Monocytes Relative: 10.8 %
NEUTROS PCT: 64 %
Neutro Abs: 2304 cells/uL (ref 1500–7800)
PLATELETS: 240 10*3/uL (ref 140–400)
RBC: 4.48 10*6/uL (ref 3.80–5.10)
RDW: 14.4 % (ref 11.0–15.0)
TOTAL LYMPHOCYTE: 22.9 %
WBC: 3.6 10*3/uL — AB (ref 3.8–10.8)

## 2018-03-26 LAB — COMPLETE METABOLIC PANEL WITH GFR
AG Ratio: 1.4 (calc) (ref 1.0–2.5)
ALKALINE PHOSPHATASE (APISO): 59 U/L (ref 37–153)
ALT: 16 U/L (ref 6–29)
AST: 17 U/L (ref 10–35)
Albumin: 4.5 g/dL (ref 3.6–5.1)
BILIRUBIN TOTAL: 0.2 mg/dL (ref 0.2–1.2)
BUN/Creatinine Ratio: 20 (calc) (ref 6–22)
BUN: 26 mg/dL — ABNORMAL HIGH (ref 7–25)
CO2: 27 mmol/L (ref 20–32)
Calcium: 10.9 mg/dL — ABNORMAL HIGH (ref 8.6–10.4)
Chloride: 100 mmol/L (ref 98–110)
Creat: 1.32 mg/dL — ABNORMAL HIGH (ref 0.50–1.05)
GFR, EST AFRICAN AMERICAN: 54 mL/min/{1.73_m2} — AB (ref >=60)
GFR, Est Non African American: 47 mL/min/{1.73_m2} — ABNORMAL LOW (ref >=60)
GLOBULIN: 3.2 g/dL (ref 1.9–3.7)
Glucose, Bld: 80 mg/dL (ref 65–99)
Potassium: 4.4 mmol/L (ref 3.5–5.3)
Sodium: 135 mmol/L (ref 135–146)
Total Protein: 7.7 g/dL (ref 6.1–8.1)

## 2018-03-26 LAB — URINALYSIS, ROUTINE W REFLEX MICROSCOPIC
BACTERIA UA: NONE SEEN /HPF
Bilirubin Urine: NEGATIVE
Glucose, UA: NEGATIVE
Hgb urine dipstick: NEGATIVE
Hyaline Cast: NONE SEEN /LPF
KETONES UR: NEGATIVE
Nitrite: NEGATIVE
PROTEIN: NEGATIVE
RBC / HPF: NONE SEEN /HPF (ref 0–2)
SPECIFIC GRAVITY, URINE: 1.01 (ref 1.001–1.03)
pH: 5.5 (ref 5.0–8.0)

## 2018-03-26 LAB — VITAMIN D 25 HYDROXY (VIT D DEFICIENCY, FRACTURES): Vit D, 25-Hydroxy: 47 ng/mL (ref 30–100)

## 2018-03-26 LAB — C3 AND C4
C3 COMPLEMENT: 122 mg/dL (ref 83–193)
C4 COMPLEMENT: 47 mg/dL (ref 15–57)

## 2018-03-26 LAB — SEDIMENTATION RATE: Sed Rate: 11 mm/h (ref 0–30)

## 2018-03-26 LAB — ANTI-DNA ANTIBODY, DOUBLE-STRANDED: ds DNA Ab: 19 IU/mL — ABNORMAL HIGH

## 2018-03-26 NOTE — Progress Notes (Signed)
Reviewed labs with Dr. Corliss Skains.  CBC stable. CMP stable. Please check on nephrology referral for elevated creatinine.  Sed rate WNL. Complements WNL.  UA revealed findings consistent with possible UTI. Please notify patient and advise her to follow up with PCP for further evaluation.  Forward labs to PCP. DsDNA is elevated but stable.  Vitamin D is within desirable range.  Please advise patient to continue on maintenance dose of vitamin D.

## 2018-04-04 ENCOUNTER — Other Ambulatory Visit: Payer: Self-pay | Admitting: Rheumatology

## 2018-04-04 NOTE — Telephone Encounter (Signed)
Last Visit: 03/24/18 Next Visit due August 2020. Labs: 03/24/18 CBC stable. CMP stable.  PLQ Eye Exam: 02/21/18 WNL  Okay to refill per Dr. Corliss Skains

## 2018-08-20 ENCOUNTER — Telehealth: Payer: Self-pay | Admitting: Rheumatology

## 2018-08-20 NOTE — Telephone Encounter (Signed)
PLQ Eye exam form faxed to eye doctor.

## 2018-08-20 NOTE — Telephone Encounter (Signed)
Patient called requesting a Plaquenil Eye Exam form be sent to Dr. Chancy Milroy.   Patient has appt on Friday 08/22/18.   FAX# [phone]

## 2018-08-28 ENCOUNTER — Telehealth: Payer: Self-pay | Admitting: Rheumatology

## 2018-08-28 NOTE — Telephone Encounter (Signed)
Patient states she needs a letter stating her medical condition for husband' job. Patient advised that we could not provide him a letter for her medical conditions.

## 2018-08-28 NOTE — Telephone Encounter (Signed)
Patient called asking to speak with you directly about Dr. Estanislado Pandy writing a note which states her medical condition and precautions.  Patient is requesting a return call.

## 2018-09-26 ENCOUNTER — Telehealth: Payer: Self-pay | Admitting: Rheumatology

## 2018-09-26 ENCOUNTER — Other Ambulatory Visit: Payer: Self-pay | Admitting: Rheumatology

## 2018-09-26 DIAGNOSIS — M3219 Other organ or system involvement in systemic lupus erythematosus: Secondary | ICD-10-CM

## 2018-09-26 NOTE — Telephone Encounter (Signed)
Patient called requesting prescription refill of Hydroxychloroquine to be sent to [facility] at [address] in [place].

## 2018-09-26 NOTE — Telephone Encounter (Signed)
Last Visit: 03/24/18 Next Visit: 10/06/18 Labs: 03/24/18 CBC stable. CMP stable.  PLQ Eye Exam: 08/22/18 WNL  Okay to refill per Dr. Estanislado Pandy

## 2018-09-26 NOTE — Telephone Encounter (Signed)
Prescription sent to the pharmacy.

## 2018-10-01 NOTE — Progress Notes (Signed)
Office Visit Note  Patient: Stephanie Myers             Date of Birth: 06/26/1965           MRN: [id number]             PCP: Loyal JacobsonKalish, Michael, MD Referring: Loyal JacobsonKalish, Michael, MD Visit Date: 10/06/2018 Occupation: @GUAROCC @  Subjective:  Medication monitoring.      History of Present Illness: Stephanie Myers is a 53 y.o. female with history of systemic lupus erythematosus and osteoarthritis. She is taking Plaquenil 200 mg twice daily Monday through Friday only.  She denies missing any doses.  She denies any recent infection.  She denies any recent flares.  She denies any mouth sores or skin rash.  She reports SI joint and hip pain which is worse after walking or standing for long periods of time.  She is interested in starting the keto-diet and taking a keto supplement to help lose weight.  Activities of Daily Living:  Patient reports morning stiffness for 5-10 minutes.   Patient Denies nocturnal pain.  Difficulty dressing/grooming: Denies Difficulty climbing stairs: Denies Difficulty getting out of chair: Denies Difficulty using hands for taps, buttons, cutlery, and/or writing: Denies  Review of Systems  Constitutional: Positive for fatigue.  HENT: Negative for mouth sores, trouble swallowing, trouble swallowing, mouth dryness and nose dryness.   Eyes: Negative for itching and dryness.  Respiratory: Negative for shortness of breath, wheezing and difficulty breathing.   Cardiovascular: Negative for chest pain and palpitations.  Gastrointestinal: Negative for abdominal pain, blood in stool, constipation and diarrhea.  Endocrine: Negative for increased urination.  Genitourinary: Negative for difficulty urinating and painful urination.  Musculoskeletal: Positive for arthralgias, joint pain and morning stiffness. Negative for joint swelling.  Skin: Negative for rash.  Allergic/Immunologic: Negative for susceptible to infections.  Neurological: Negative for dizziness, numbness, headaches,  memory loss and weakness.  Hematological: Negative for bruising/bleeding tendency.  Psychiatric/Behavioral: Negative for confusion and sleep disturbance. The patient is not nervous/anxious.     PMFS History:  Patient Active Problem List   Diagnosis Date Noted  . Essential hypertension 04/17/2016  . 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 High risk medication use 12/20/2015  . 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 Osteoarthritis of foot 12/20/2015  . Bilateral primary osteoarthritis of knee 12/20/2015    Past Medical History:  Diagnosis Date  . Anemia   . Diabetes mellitus without complication (HCC)   . Hyperlipidemia   . Hypertension   . Lupus (HCC)   . Shingles     Family History  Problem Relation Age of Onset  . Healthy Daughter   . Healthy Son    Past Surgical History:  Procedure Laterality Date  . ABDOMINAL HYSTERECTOMY    . CESAREAN SECTION     Social History   Social History Narrative  . Not on file   Immunization History  Administered Date(s) Administered  . Hepatitis B 12/05/2016, 06/04/2017, 12/26/2017  . Influenza,inj,Quad PF,6+ Mos 10/07/2015, 12/05/2016, 12/26/2017  . Pneumococcal Polysaccharide-23 03/05/2011  . Tdap 03/05/2011     Objective: Vital Signs: BP 113/80 (BP Location: Left Arm, Patient Position: Sitting, Cuff Size: Normal)   Pulse 78   Resp 14   Ht 5\' 5"  (1.651 m)   Wt 206 lb 3.2 oz (93.5 kg)   BMI 34.31 kg/m    Physical Exam Vitals signs and nursing note reviewed.  Constitutional:      Appearance:  She is well-developed.  HENT:     Head: Normocephalic and atraumatic.  Eyes:     Conjunctiva/sclera: Conjunctivae normal.  Neck:     Musculoskeletal: Normal range of motion.  Cardiovascular:     Rate and Rhythm: Normal rate and regular rhythm.     Heart sounds: Normal heart sounds.  Pulmonary:     Effort: Pulmonary effort is normal.     Breath sounds: Normal breath sounds.  Abdominal:      General: Bowel sounds are normal.     Palpations: Abdomen is soft.  Lymphadenopathy:     Cervical: No cervical adenopathy.  Skin:    General: Skin is warm and dry.     Capillary Refill: Capillary refill takes less than 2 seconds.  Neurological:     Mental Status: She is alert and oriented to person, place, and time.  Psychiatric:        Behavior: Behavior normal.      Musculoskeletal Exam: C-spine thoracic and lumbar spine with good range of motion.  She had some tenderness over right gluteal region.  Mild tenderness over trochanteric area.  Shoulder joints, elbow joints, wrist joints, MCPs PIPs and DIPs with good range of motion with no synovitis.  Hip joints, knee joints, MCPs PIPs and DIPs with good range of motion with no synovitis.  CDAI Exam: CDAI Score: - Patient Global: -; Provider Global: - Swollen: -; Tender: - Joint Exam   No joint exam has been documented for this visit   There is currently no information documented on the homunculus. Go to the Rheumatology activity and complete the homunculus joint exam.  Investigation: No additional findings.  Imaging: No results found.  Recent Labs: Lab Results  Component Value Date   WBC 3.6 (L) 03/24/2018   HGB 10.4 (L) 03/24/2018   PLT 240 03/24/2018   NA 135 03/24/2018   K 4.4 03/24/2018   CL 100 03/24/2018   CO2 27 03/24/2018   GLUCOSE 80 03/24/2018   BUN 26 (H) 03/24/2018   CREATININE 1.32 (H) 03/24/2018   BILITOT 0.2 03/24/2018   ALKPHOS 69 09/03/2016   AST 17 03/24/2018   ALT 16 03/24/2018   PROT 7.7 03/24/2018   ALBUMIN 4.5 09/03/2016   CALCIUM 10.9 (H) 03/24/2018   GFRAA 54 (L) 03/24/2018    Speciality Comments: PLQ Eye Exam: 08/22/18 @ [facility] follow up in 6 months   Procedures:  No procedures performed Allergies: Lisinopril   Assessment / Plan:     Visit Diagnoses: Other organ or system involvement in systemic lupus erythematosus (HCC) -  +ANA, +ds DNA, + Smith, RNP and Ro -patient  continues to do well.  She denies any flare of lupus.  I will obtain following labs today.  She continues to have elevated double-stranded DNA although her complements and sed rate were normal at the last visit.  Plan: Urinalysis, Routine w reflex microscopic, Anti-DNA antibody, double-stranded, C3 and C4, Sedimentation rate  High risk medication use -Plaquenil 200 mg 1 tablet twice daily Monday through Friday only.  Last Plaquenil eye exam normal on 08/22/2018. Most recent CBC/CMP showed elevated creatinine, elevated calcium, low WBC, and low hemoglobin on 03/24/2018. Due for CBC/CMP today and will monitor every 5 months.  She previously received Pneumovax 23 vaccine.  Recommend annual influenza, Prevnar 13, and Shingrix as indicated for immunosuppressant therapy.: 02/21/2018 - Plan: CBC with Differential/Platelet, COMPLETE METABOLIC PANEL WITH GFR  Primary osteoarthritis of both hands-she has mild symptoms and not very symptomatic.  Bilateral primary osteoarthritis of knee-she is off and on discomfort in her knee joints.  Weight loss was discussed.  Primary osteoarthritis of both feet-doing well.  Trochanteric bursitis of both hips-she has been advised to do some stretching exercises.  Essential hypertension-blood pressure is controlled today.  BMI 34.31-weight loss diet and exercise was discussed at length.  Association of heart disease with autoimmune disease was also emphasized.  Monitoring blood pressure, lipid panel, hemoglobin A1c and regular exercise at least 30 minutes daily was discussed.  Orders: Orders Placed This Encounter  Procedures  . CBC with Differential/Platelet  . COMPLETE METABOLIC PANEL WITH GFR  . Urinalysis, Routine w reflex microscopic  . Anti-DNA antibody, double-stranded  . C3 and C4  . Sedimentation rate   No orders of the defined types were placed in this encounter.    Follow-Up Instructions: Return in about 5 months (around 03/08/2019) for Systemic lupus.    Bo Merino, MD  Note - This record has been created using Editor, commissioning.  Chart creation errors have been sought, but may not always  have been located. Such creation errors do not reflect on  the standard of medical care.

## 2018-10-06 ENCOUNTER — Other Ambulatory Visit: Payer: Self-pay

## 2018-10-06 ENCOUNTER — Encounter: Payer: Self-pay | Admitting: Rheumatology

## 2018-10-06 ENCOUNTER — Ambulatory Visit (INDEPENDENT_AMBULATORY_CARE_PROVIDER_SITE_OTHER): Payer: BC Managed Care – PPO | Admitting: Rheumatology

## 2018-10-06 VITALS — BP 113/80 | HR 78 | Resp 14 | Ht 65.0 in | Wt 206.2 lb

## 2018-10-06 DIAGNOSIS — M3219 Other organ or system involvement in systemic lupus erythematosus: Secondary | ICD-10-CM | POA: Diagnosis not present

## 2018-10-06 DIAGNOSIS — Z79899 Other long term (current) drug therapy: Secondary | ICD-10-CM

## 2018-10-06 DIAGNOSIS — M19042 Primary osteoarthritis, left hand: Secondary | ICD-10-CM

## 2018-10-06 DIAGNOSIS — M7062 Trochanteric bursitis, left hip: Secondary | ICD-10-CM

## 2018-10-06 DIAGNOSIS — M19072 Primary osteoarthritis, left ankle and foot: Secondary | ICD-10-CM

## 2018-10-06 DIAGNOSIS — M19041 Primary osteoarthritis, right hand: Secondary | ICD-10-CM | POA: Diagnosis not present

## 2018-10-06 DIAGNOSIS — M7061 Trochanteric bursitis, right hip: Secondary | ICD-10-CM

## 2018-10-06 DIAGNOSIS — I10 Essential (primary) hypertension: Secondary | ICD-10-CM

## 2018-10-06 DIAGNOSIS — M170 Bilateral primary osteoarthritis of knee: Secondary | ICD-10-CM | POA: Diagnosis not present

## 2018-10-06 DIAGNOSIS — M19071 Primary osteoarthritis, right ankle and foot: Secondary | ICD-10-CM

## 2018-10-06 DIAGNOSIS — Z6834 Body mass index (BMI) 34.0-34.9, adult: Secondary | ICD-10-CM

## 2018-10-06 NOTE — Patient Instructions (Addendum)
Recommend Goli gummie supplement over Keto-slim.  May discontinue after 3-4 weeks if you do not notice any benefit.  Standing Labs We placed an order today for your standing lab work.    Please come back and get your standing labs in February and every 5 months.   We have open lab daily Monday through Thursday from 8:30-12:30 PM and 1:30-4:30 PM and Friday from 8:30-12:30 PM and 1:30 -4:00 PM at the office of Dr. Bo Merino.   You may experience shorter wait times on Monday and Friday afternoons. The office is located at [address] [place], [place], [place] [address] No appointment is necessary.   Labs are drawn by [facility].  You may receive a bill from [facility] for your lab work.  If you wish to have your labs drawn at another location, please call the office 24 hours in advance to send orders.  If you have any questions regarding directions or hours of operation,  please call [phone].   Just as a reminder please drink plenty of water prior to coming for your lab work. Thanks!  Vaccines You are taking a medication(s) that can suppress your immune system.  The following immunizations are recommended: . Flu annually . Pneumonia (Pneumovax 23 and Prevnar 13 spaced at least 1 year apart) . Shingrix  Please check with your PCP to make sure you are up to date.

## 2018-10-07 NOTE — Progress Notes (Signed)
White cell count is low but is stable.  Rest the labs are stable.  She should continue Plaquenil.  Please forward labs to her PCP.

## 2018-10-08 LAB — URINALYSIS, ROUTINE W REFLEX MICROSCOPIC
Bilirubin Urine: NEGATIVE
Glucose, UA: NEGATIVE
Hgb urine dipstick: NEGATIVE
Ketones, ur: NEGATIVE
Leukocytes,Ua: NEGATIVE
Nitrite: NEGATIVE
Protein, ur: NEGATIVE
Specific Gravity, Urine: 1.011 (ref 1.001–1.03)
pH: <=5 (ref 5.0–8.0)

## 2018-10-08 LAB — COMPLETE METABOLIC PANEL WITH GFR
AG Ratio: 1.3 (calc) (ref 1.0–2.5)
ALT: 26 U/L (ref 6–29)
AST: 21 U/L (ref 10–35)
Albumin: 4.5 g/dL (ref 3.6–5.1)
Alkaline phosphatase (APISO): 64 U/L (ref 37–153)
BUN/Creatinine Ratio: 15 (calc) (ref 6–22)
BUN: 19 mg/dL (ref 7–25)
CO2: 26 mmol/L (ref 20–32)
Calcium: 10.5 mg/dL — ABNORMAL HIGH (ref 8.6–10.4)
Chloride: 102 mmol/L (ref 98–110)
Creat: 1.26 mg/dL — ABNORMAL HIGH (ref 0.50–1.05)
GFR, Est African American: 57 mL/min/{1.73_m2} — ABNORMAL LOW (ref >=60)
GFR, Est Non African American: 49 mL/min/{1.73_m2} — ABNORMAL LOW (ref >=60)
Globulin: 3.5 g/dL (calc) (ref 1.9–3.7)
Glucose, Bld: 97 mg/dL (ref 65–99)
Potassium: 4.5 mmol/L (ref 3.5–5.3)
Sodium: 138 mmol/L (ref 135–146)
Total Bilirubin: 0.3 mg/dL (ref 0.2–1.2)
Total Protein: 8 g/dL (ref 6.1–8.1)

## 2018-10-08 LAB — CBC WITH DIFFERENTIAL/PLATELET
Absolute Monocytes: 248 cells/uL (ref 200–950)
Basophils Absolute: 20 cells/uL (ref 0–200)
Basophils Relative: 0.8 %
Eosinophils Absolute: 90 cells/uL (ref 15–500)
Eosinophils Relative: 3.6 %
HCT: 35.8 % (ref 35.0–45.0)
Hemoglobin: 10.7 g/dL — ABNORMAL LOW (ref 11.7–15.5)
Lymphs Abs: 685 cells/uL — ABNORMAL LOW (ref 850–3900)
MCH: 22.7 pg — ABNORMAL LOW (ref 27.0–33.0)
MCHC: 29.9 g/dL — ABNORMAL LOW (ref 32.0–36.0)
MCV: 75.8 fL — ABNORMAL LOW (ref 80.0–100.0)
MPV: 11.7 fL (ref 7.5–12.5)
Monocytes Relative: 9.9 %
Neutro Abs: 1458 cells/uL — ABNORMAL LOW (ref 1500–7800)
Neutrophils Relative %: 58.3 %
Platelets: 217 10*3/uL (ref 140–400)
RBC: 4.72 10*6/uL (ref 3.80–5.10)
RDW: 15 % (ref 11.0–15.0)
Total Lymphocyte: 27.4 %
WBC: 2.5 10*3/uL — ABNORMAL LOW (ref 3.8–10.8)

## 2018-10-08 LAB — SEDIMENTATION RATE: Sed Rate: 9 mm/h (ref 0–30)

## 2018-10-08 LAB — C3 AND C4
C3 Complement: 119 mg/dL (ref 83–193)
C4 Complement: 47 mg/dL (ref 15–57)

## 2018-10-08 LAB — ANTI-DNA ANTIBODY, DOUBLE-STRANDED: ds DNA Ab: 19 IU/mL — ABNORMAL HIGH

## 2018-12-26 ENCOUNTER — Telehealth: Payer: Self-pay | Admitting: Rheumatology

## 2018-12-26 DIAGNOSIS — M3219 Other organ or system involvement in systemic lupus erythematosus: Secondary | ICD-10-CM

## 2018-12-26 MED ORDER — HYDROXYCHLOROQUINE SULFATE 200 MG PO TABS: ORAL_TABLET | ORAL | 0 refills | Status: DC

## 2018-12-26 NOTE — Telephone Encounter (Signed)
Patient left a message requesting a refill on generic Plaquenil.

## 2018-12-26 NOTE — Telephone Encounter (Signed)
Last Visit: 10/06/2018 Next Visit: 03/09/2019 Labs: 10/06/2018 White cell count is low but is stable. Rest the labs are stable. She should continue Plaquenil. Eye exam: 08/22/2018  Okay to refill per Dr. Estanislado Pandy.

## 2018-12-30 ENCOUNTER — Telehealth: Payer: Self-pay | Admitting: Rheumatology

## 2018-12-30 NOTE — Telephone Encounter (Signed)
Patient called stating she called [facility] this morning and was informed that they have not filled her prescription of Plaquenil because they are waiting on information from Dr. Estanislado Pandy.

## 2018-12-30 NOTE — Telephone Encounter (Signed)
PLQ Prescription was sent on 12/26/2018. I spoke to the pharmacy today and they will get the prescription ready for the patient. Attempted to contact patient and left message on machine to advise.

## 2019-03-04 NOTE — Progress Notes (Deleted)
Office Visit Note  Patient: Stephanie Myers             Date of Birth: 03/29/1965           MRN: [id number]             PCP: Loyal Jacobson, MD Referring: Loyal Jacobson, MD Visit Date: 03/09/2019 Occupation: @GUAROCC @  Subjective:  No chief complaint on file.   History of Present Illness: Stephanie Myers is a 54 y.o. female ***   Activities of Daily Living:  Patient reports morning stiffness for *** {minute/hour:19697}.   Patient {ACTIONS;DENIES/REPORTS:21021675::"Denies"} nocturnal pain.  Difficulty dressing/grooming: {ACTIONS;DENIES/REPORTS:21021675::"Denies"} Difficulty climbing stairs: {ACTIONS;DENIES/REPORTS:21021675::"Denies"} Difficulty getting out of chair: {ACTIONS;DENIES/REPORTS:21021675::"Denies"} Difficulty using hands for taps, buttons, cutlery, and/or writing: {ACTIONS;DENIES/REPORTS:21021675::"Denies"}  No Rheumatology ROS completed.   PMFS History:  Patient Active Problem List   Diagnosis Date Noted  . Essential hypertension 04/17/2016  . 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 High risk medication use 12/20/2015  . 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 Osteoarthritis of foot 12/20/2015  . Bilateral primary osteoarthritis of knee 12/20/2015    Past Medical History:  Diagnosis Date  . Anemia   . Diabetes mellitus without complication (HCC)   . Hyperlipidemia   . Hypertension   . Lupus (HCC)   . Shingles     Family History  Problem Relation Age of Onset  . Healthy Daughter   . Healthy Son    Past Surgical History:  Procedure Laterality Date  . ABDOMINAL HYSTERECTOMY    . CESAREAN SECTION     Social History   Social History Narrative  . Not on file   Immunization History  Administered Date(s) Administered  . Hepatitis B 12/05/2016, 06/04/2017, 12/26/2017  . Influenza,inj,Quad PF,6+ Mos 10/07/2015, 12/05/2016, 12/26/2017  . Pneumococcal Polysaccharide-23 03/05/2011  . Tdap 03/05/2011      Objective: Vital Signs: There were no vitals taken for this visit.   Physical Exam   Musculoskeletal Exam: ***  CDAI Exam: CDAI Score: -- Patient Global: --; Provider Global: -- Swollen: --; Tender: -- Joint Exam 03/09/2019   No joint exam has been documented for this visit   There is currently no information documented on the homunculus. Go to the Rheumatology activity and complete the homunculus joint exam.  Investigation: No additional findings.  Imaging: No results found.  Recent Labs: Lab Results  Component Value Date   WBC 2.5 (L) 10/06/2018   HGB 10.7 (L) 10/06/2018   PLT 217 10/06/2018   NA 138 10/06/2018   K 4.5 10/06/2018   CL 102 10/06/2018   CO2 26 10/06/2018   GLUCOSE 97 10/06/2018   BUN 19 10/06/2018   CREATININE 1.26 (H) 10/06/2018   BILITOT 0.3 10/06/2018   ALKPHOS 69 09/03/2016   AST 21 10/06/2018   ALT 26 10/06/2018   PROT 8.0 10/06/2018   ALBUMIN 4.5 09/03/2016   CALCIUM 10.5 (H) 10/06/2018   GFRAA 57 (L) 10/06/2018    Speciality Comments: PLQ Eye Exam: 08/22/18 @ [facility] follow up in 6 months   Procedures:  No procedures performed Allergies: Lisinopril   Assessment / Plan:     Visit Diagnoses: No diagnosis found.  Orders: No orders of the defined types were placed in this encounter.  No orders of the defined types were placed in this encounter.   Face-to-face time spent with patient was *** minutes. Greater than 50% of time was spent in counseling and coordination of care.  Follow-Up Instructions:  No follow-ups on file.   Earnestine Mealing, CMA  Note - This record has been created using Editor, commissioning.  Chart creation errors have been sought, but may not always  have been located. Such creation errors do not reflect on  the standard of medical care.

## 2019-03-09 ENCOUNTER — Ambulatory Visit: Payer: PRIVATE HEALTH INSURANCE | Admitting: Rheumatology

## 2019-03-18 ENCOUNTER — Telehealth: Payer: Self-pay | Admitting: Rheumatology

## 2019-03-18 DIAGNOSIS — M3219 Other organ or system involvement in systemic lupus erythematosus: Secondary | ICD-10-CM

## 2019-03-18 MED ORDER — HYDROXYCHLOROQUINE SULFATE 200 MG PO TABS: ORAL_TABLET | ORAL | 0 refills | Status: DC

## 2019-03-18 NOTE — Telephone Encounter (Signed)
Last Visit: 10/06/18 Next Visit: 03/31/19 Labs: 10/06/18 white cell count is low but is stable. Rest the labs are stable Eye exam: : 08/22/18  Okay to refill per Dr. Corliss Skains

## 2019-03-18 NOTE — Telephone Encounter (Signed)
Patient request a refill on Generic Plaquenil sent to [facility] on [address].

## 2019-03-23 NOTE — Progress Notes (Signed)
Office Visit Note  Patient: Stephanie Myers             Date of Birth: 07-29-1965           MRN: [id number]             PCP: Loyal Jacobson, MD Referring: Loyal Jacobson, MD Visit Date: 03/31/2019 Occupation: @GUAROCC @  Subjective:  Medication monitoring   History of Present Illness: Stephanie Myers is a 54 y.o. female with history of systemic lupus erythematosus.  She is taking plaquenil 200 mg 1 tablet by mouth twice daily M-F.  She denies any recent lupus flares.  She denies any joint swelling at this time.  She denies any recent rashes, hair loss, photosensitivity, Raynaud's, oral/nasal ulcers, sicca symptoms, enlarged lymph nodes, chest pain or shortness of breath.  She has been experiencing intermittent lower back pain.  She denies any symptoms of radiculopathy.    Activities of Daily Living:  Patient reports morning stiffness for 0 minutes.   Patient Denies nocturnal pain.  Difficulty dressing/grooming: Denies Difficulty climbing stairs: Denies Difficulty getting out of chair: Denies Difficulty using hands for taps, buttons, cutlery, and/or writing: Denies  Review of Systems  Constitutional: Negative for fatigue.  HENT: Negative for mouth sores, mouth dryness and nose dryness.   Eyes: Negative for pain, itching, visual disturbance and dryness.  Respiratory: Negative for cough, hemoptysis, shortness of breath and difficulty breathing.   Cardiovascular: Negative for chest pain, palpitations, hypertension and swelling in legs/feet.  Gastrointestinal: Negative for blood in stool, constipation and diarrhea.  Endocrine: Negative for increased urination.  Genitourinary: Negative for difficulty urinating and painful urination.  Musculoskeletal: Positive for arthralgias and joint pain. Negative for joint swelling, myalgias, muscle weakness, morning stiffness, muscle tenderness and myalgias.  Skin: Negative for color change, pallor, rash, hair loss, nodules/bumps, redness, skin  tightness, ulcers and sensitivity to sunlight.  Allergic/Immunologic: Negative for susceptible to infections.  Neurological: Negative for dizziness, numbness, headaches, memory loss and weakness.  Hematological: Negative for swollen glands.  Psychiatric/Behavioral: Negative for depressed mood, confusion and sleep disturbance. The patient is not nervous/anxious.     PMFS History:  Patient Active Problem List   Diagnosis Date Noted  . Essential hypertension 04/17/2016  . 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 High risk medication use 12/20/2015  . 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 Osteoarthritis of foot 12/20/2015  . Bilateral primary osteoarthritis of knee 12/20/2015    Past Medical History:  Diagnosis Date  . Anemia   . Diabetes mellitus without complication (HCC)   . Hyperlipidemia   . Hypertension   . Lupus (HCC)   . Shingles     Family History  Problem Relation Age of Onset  . Healthy Daughter   . Healthy Son    Past Surgical History:  Procedure Laterality Date  . ABDOMINAL HYSTERECTOMY    . CESAREAN SECTION     Social History   Social History Narrative  . Not on file   Immunization History  Administered Date(s) Administered  . Hepatitis B 12/05/2016, 06/04/2017, 12/26/2017  . Influenza,inj,Quad PF,6+ Mos 10/07/2015, 12/05/2016, 12/26/2017  . Pneumococcal Polysaccharide-23 03/05/2011  . Tdap 03/05/2011     Objective: Vital Signs: BP 105/74 (BP Location: Left Arm, Patient Position: Sitting, Cuff Size: Normal)   Pulse 86   Resp 14   Ht 5\' 4"  (1.626 m)   Wt 209 lb (94.8 kg)   BMI 35.87 kg/m    Physical Exam Vitals  and nursing note reviewed.  Constitutional:      Appearance: She is well-developed.  HENT:     Head: Normocephalic and atraumatic.  Eyes:     Conjunctiva/sclera: Conjunctivae normal.  Pulmonary:     Effort: Pulmonary effort is normal.  Abdominal:     General: Bowel sounds are normal.      Palpations: Abdomen is soft.  Musculoskeletal:     Cervical back: Normal range of motion.  Lymphadenopathy:     Cervical: No cervical adenopathy.  Skin:    General: Skin is warm and dry.     Capillary Refill: Capillary refill takes less than 2 seconds.  Neurological:     Mental Status: She is alert and oriented to person, place, and time.  Psychiatric:        Behavior: Behavior normal.      Musculoskeletal Exam: C-spine, thoracic spine, and lumbar spine good ROM.  No midline spinal tenderness.  No SI joint tenderness.  Shoulder joints, elbow joints, wrist joints, MCPs, PIPs, and DIPs good ROM with no synovitis.  Complete fist formation bilaterally.  Hip joints, knee joints, ankle joints, MTPs, PIPs, and DIPs good ROM with no synovitis.  No warmth or effusion of knee joints.  No tenderness or swelling of ankle joints.    CDAI Exam: CDAI Score: -- Patient Global: --; Provider Global: -- Swollen: --; Tender: -- Joint Exam 03/31/2019   No joint exam has been documented for this visit   There is currently no information documented on the homunculus. Go to the Rheumatology activity and complete the homunculus joint exam.  Investigation: No additional findings.  Imaging: No results found.  Recent Labs: Lab Results  Component Value Date   WBC 2.5 (L) 10/06/2018   HGB 10.7 (L) 10/06/2018   PLT 217 10/06/2018   NA 138 10/06/2018   K 4.5 10/06/2018   CL 102 10/06/2018   CO2 26 10/06/2018   GLUCOSE 97 10/06/2018   BUN 19 10/06/2018   CREATININE 1.26 (H) 10/06/2018   BILITOT 0.3 10/06/2018   ALKPHOS 69 09/03/2016   AST 21 10/06/2018   ALT 26 10/06/2018   PROT 8.0 10/06/2018   ALBUMIN 4.5 09/03/2016   CALCIUM 10.5 (H) 10/06/2018   GFRAA 57 (L) 10/06/2018    Speciality Comments: PLQ Eye Exam: 08/22/18 @ [facility] follow up in 6 months   Procedures:  No procedures performed Allergies: Lisinopril   Assessment / Plan:     Visit Diagnoses: Other organ or system  involvement in systemic lupus erythematosus ([place]) - +ANA, +ds DNA, + Smith, RNP and Ro: She has not had any signs or symptoms of a systemic lupus flare.  She is clinically doing well on Plaquenil 200 mg 1 tablet by mouth twice daily M-F.  She has no synovitis or joint tenderness on exam.  She has not had any recent rashes, hair loss, or photosensitivity.  We discussed the importance of wearing SPF greater than 50 sunscreen on a daily basis as well as avoiding direct sun exposure.  No malar rash noted.  She has not had any symptoms of Raynaud's and no digital ulcerations or signs of gangrene were noted on exam. She has not had any oral or nasal ulcerations.  No sicca symptoms.  No palpable cervical lymphadenopathy.  She will continue taking Plaquenil 200 mg 1 tablet by mouth twice daily Monday through Friday.  We will update autoimmune lab work today.  She was advised to notify us if she develops any signs  or symptoms of a flare.  She will follow-up in the office in 5 months.- Plan: CBC with Differential/Platelet, COMPLETE METABOLIC PANEL WITH GFR, Urinalysis, Routine w reflex microscopic, Anti-DNA antibody, double-stranded, C3 and C4, Sedimentation rate  High risk medication use - Plaquenil 200 mg 1 tablet twice daily Monday through Friday only. Eye Exam: 08/22/18.  CBC and CMP will be drawn today to monitor for drug toxicity.- Plan: CBC with Differential/Platelet, COMPLETE METABOLIC PANEL WITH GFR  Primary osteoarthritis of both hands: She has no tenderness or synovitis on exam.  She has complete fist formation bilaterally.  Joint protection and muscle strengthening were discussed.  Bilateral primary osteoarthritis of knee: She has good range of motion of both knee joints on exam.  No warmth or effusion was noted.  Primary osteoarthritis of both feet: She has no discomfort in her feet at this time.  She was proper fitting shoes.  Trochanteric bursitis of both hips: Resolved.  She has no tenderness on exam  today.  Essential hypertension: BP was 105/74 today in the office.   Lumbar facet arthropathy: She experiences intermittent lower back pain.  She has not had any symptoms of radiculopathy recently.  She has good range of motion of the thoracic and lumbar spine on exam.  She has no midline spinal tenderness or SI joint tenderness at this time.  X-rays of the lumbar spine on 10/23/2017 revealed facet joint arthropathy.  X-rays of the pelvis were unremarkable at that time.  We discussed the importance of performing back exercises and core strengthening.  We also discussed the importance of weight loss.  She was advised to notify us if she develops any new or worsening symptoms.    Orders: Orders Placed This Encounter  Procedures  . CBC with Differential/Platelet  . COMPLETE METABOLIC PANEL WITH GFR  . Urinalysis, Routine w reflex microscopic  . Anti-DNA antibody, double-stranded  . C3 and C4  . Sedimentation rate   No orders of the defined types were placed in this encounter.    Follow-Up Instructions: Return in about 5 months (around 08/31/2019) for Systemic lupus erythematosus.   Gearldine Bienenstock, PA-C  Note - This record has been created using Dragon software.  Chart creation errors have been sought, but may not always  have been located. Such creation errors do not reflect on  the standard of medical care.

## 2019-03-31 ENCOUNTER — Encounter: Payer: Self-pay | Admitting: Physician Assistant

## 2019-03-31 ENCOUNTER — Other Ambulatory Visit: Payer: Self-pay

## 2019-03-31 ENCOUNTER — Ambulatory Visit: Payer: BC Managed Care – PPO | Admitting: Physician Assistant

## 2019-03-31 VITALS — BP 105/74 | HR 86 | Resp 14 | Ht 64.0 in | Wt 209.0 lb

## 2019-03-31 DIAGNOSIS — M170 Bilateral primary osteoarthritis of knee: Secondary | ICD-10-CM

## 2019-03-31 DIAGNOSIS — M19071 Primary osteoarthritis, right ankle and foot: Secondary | ICD-10-CM

## 2019-03-31 DIAGNOSIS — M19072 Primary osteoarthritis, left ankle and foot: Secondary | ICD-10-CM

## 2019-03-31 DIAGNOSIS — Z79899 Other long term (current) drug therapy: Secondary | ICD-10-CM

## 2019-03-31 DIAGNOSIS — M7062 Trochanteric bursitis, left hip: Secondary | ICD-10-CM

## 2019-03-31 DIAGNOSIS — M19042 Primary osteoarthritis, left hand: Secondary | ICD-10-CM

## 2019-03-31 DIAGNOSIS — M19041 Primary osteoarthritis, right hand: Secondary | ICD-10-CM

## 2019-03-31 DIAGNOSIS — M47816 Spondylosis without myelopathy or radiculopathy, lumbar region: Secondary | ICD-10-CM

## 2019-03-31 DIAGNOSIS — M3219 Other organ or system involvement in systemic lupus erythematosus: Secondary | ICD-10-CM | POA: Diagnosis not present

## 2019-03-31 DIAGNOSIS — M7061 Trochanteric bursitis, right hip: Secondary | ICD-10-CM

## 2019-03-31 DIAGNOSIS — I10 Essential (primary) hypertension: Secondary | ICD-10-CM

## 2019-03-31 NOTE — Patient Instructions (Signed)
Core Strength Exercises  Core exercises help to build strength in the muscles between your ribs and your hips (abdominal muscles). These muscles help to support your body and keep your spine stable. It is important to maintain strength in your core to prevent injury and pain. Some activities, such as yoga and Pilates, can help to strengthen core muscles. You can also strengthen core muscles with exercises at home. It is important to talk to your health care provider before you start a new exercise routine. What are the benefits of core strength exercises? Core strength exercises can:  Reduce back pain.  Help to rebuild strength after a back or spine injury.  Help to prevent injury during physical activity, especially injuries to the back and knees. How to do core strength exercises Repeat these exercises 10-15 times, or until you are tired. Do exercises exactly as told by your health care provider and adjust them as directed. It is normal to feel mild stretching, pulling, tightness, or discomfort as you do these exercises. If you feel any pain while doing these exercises, stop. If your pain continues or gets worse when doing core exercises, contact your health care provider. You may want to use a padded yoga or exercise mat for strength exercises that are done on the floor. Bridging  1. Lie on your back on a firm surface with your knees bent and your feet flat on the floor. 2. Raise your hips so that your knees, hips, and shoulders form a straight line together. Keep your abdominal muscles tight. 3. Hold this position for 3-5 seconds. 4. Slowly lower your hips to the starting position. 5. Let your muscles relax completely between repetitions. Single-leg bridge 1. Lie on your back on a firm surface with your knees bent and your feet flat on the floor. 2. Raise your hips so that your knees, hips, and shoulders form a straight line together. Keep your abdominal muscles tight. 3. Lift one foot  off the floor, then completely straighten that leg. 4. Hold this position for 3-5 seconds. 5. Put the straight leg back down in the bent position. 6. Slowly lower your hips to the starting position. 7. Repeat these steps using your other leg. Side bridge 1. Lie on your side with your knees bent. Prop yourself up on the elbow that is near the floor. 2. Using your abdominal muscles and your elbow that is on the floor, raise your body off the floor. Raise your hip so that your shoulder, hip, and foot form a straight line together. 3. Hold this position for 10 seconds. Keep your head and neck raised and away from your shoulder (in their normal, neutral position). Keep your abdominal muscles tight. 4. Slowly lower your hip to the starting position. 5. Repeat and try to hold this position longer, working your way up to 30 seconds. Abdominal crunch 1. Lie on your back on a firm surface. Bend your knees and keep your feet flat on the floor. 2. Cross your arms over your chest. 3. Without bending your neck, tip your chin slightly toward your chest. 4. Tighten your abdominal muscles as you lift your chest just high enough to lift your shoulder blades off of the floor. Do not hold your breath. You can do this with short lifts or long lifts. 5. Slowly return to the starting position. Bird dog 1. Get on your hands and knees, with your legs shoulder-width apart and your arms under your shoulders. Keep your back straight. 2. Tighten   your abdominal muscles. 3. Raise one of your legs off the floor and straighten it. Try to keep it parallel to the floor. 4. Slowly lower your leg to the starting position. 5. Raise one of your arms off the floor and straighten it. Try to keep it parallel to the floor. 6. Slowly lower your arm to the starting position. 7. Repeat with the other arm and leg. If possible, try raising a leg and arm at the same time, on opposite sides of the body. For example, raise your left hand and  your right leg. Plank 1. Lie on your belly. 2. Prop up your body onto your forearms and your feet, keeping your legs straight. Your body should make a straight line between your shoulders and feet. 3. Hold this position for 10 seconds while keeping your abdominal muscles tight. 4. Lower your body to the starting position. 5. Repeat and try to hold this position longer, working your way up to 30 seconds. Cross-core strengthening 1. Stand with your feet shoulder-width apart. 2. Hold a ball out in front of you. Keep your arms straight. 3. Tighten your abdominal muscles and slowly rotate at your waist from side to side. Keep your feet flat. 4. Once you are comfortable, try repeating this exercise with a heavier ball. Top core strengthening 1. Stand about 18 inches (46 cm) in front of a wall, with your back to the wall. 2. Keep your feet flat and shoulder-width apart. 3. Tighten your abdominal muscles. 4. Bend your hips and knees. 5. Slowly reach between your legs to touch the wall behind you. 6. Slowly stand back up. 7. Raise your arms over your head and reach behind you. 8. Return to the starting position. General tips  Do not do any exercises that cause pain. If you have pain while exercising, talk to your health care provider.  Always stretch before and after doing these exercises. This can help prevent injury.  Maintain a healthy weight. Ask your health care provider what weight is healthy for you. Contact a health care provider if:  You have back pain that gets worse or does not go away.  You feel pain while doing core strength exercises. Get help right away if:  You have severe pain that does not get better with medicine. Summary  Core exercises help to build strength in the muscles between your ribs and your waist.  Core muscles help to support your body and keep your spine stable.  Some activities, such as yoga and Pilates, can help to strengthen core muscles.  Core  strength exercises can help back pain and can prevent injury.  If you feel any pain while doing core strength exercises, stop. This information is not intended to replace advice given to you by your health care provider. Make sure you discuss any questions you have with your health care provider. Document Revised: 04/23/2018 Document Reviewed: 05/23/2016 Elsevier Patient Education  2020 [employer] Back Exercises These exercises help to make your trunk and back strong. They also help to keep the lower back flexible. Doing these exercises can help to prevent back pain or lessen existing pain.  If you have back pain, try to do these exercises 2-3 times each day or as told by your doctor.  As you get better, do the exercises once each day. Repeat the exercises more often as told by your doctor.  To stop back pain from coming back, do the exercises once each day, or as told by your   doctor. Exercises Single knee to chest Do these steps 3-5 times in a row for each leg: 1. Lie on your back on a firm bed or the floor with your legs stretched out. 2. Bring one knee to your chest. 3. Grab your knee or thigh with both hands and hold them it in place. 4. Pull on your knee until you feel a gentle stretch in your lower back or buttocks. 5. Keep doing the stretch for 10-30 seconds. 6. Slowly let go of your leg and straighten it. Pelvic tilt Do these steps 5-10 times in a row: 1. Lie on your back on a firm bed or the floor with your legs stretched out. 2. Bend your knees so they point up to the ceiling. Your feet should be flat on the floor. 3. Tighten your lower belly (abdomen) muscles to press your lower back against the floor. This will make your tailbone point up to the ceiling instead of pointing down to your feet or the floor. 4. Stay in this position for 5-10 seconds while you gently tighten your muscles and breathe evenly. Cat-cow Do these steps until your lower back bends more  easily: 1. Get on your hands and knees on a firm surface. Keep your hands under your shoulders, and keep your knees under your hips. You may put padding under your knees. 2. Let your head hang down toward your chest. Tighten (contract) the muscles in your belly. Point your tailbone toward the floor so your lower back becomes rounded like the back of a cat. 3. Stay in this position for 5 seconds. 4. Slowly lift your head. Let the muscles of your belly relax. Point your tailbone up toward the ceiling so your back forms a sagging arch like the back of a cow. 5. Stay in this position for 5 seconds.  Press-ups Do these steps 5-10 times in a row: 1. Lie on your belly (face-down) on the floor. 2. Place your hands near your head, about shoulder-width apart. 3. While you keep your back relaxed and keep your hips on the floor, slowly straighten your arms to raise the top half of your body and lift your shoulders. Do not use your back muscles. You may change where you place your hands in order to make yourself more comfortable. 4. Stay in this position for 5 seconds. 5. Slowly return to lying flat on the floor.  Bridges Do these steps 10 times in a row: 1. Lie on your back on a firm surface. 2. Bend your knees so they point up to the ceiling. Your feet should be flat on the floor. Your arms should be flat at your sides, next to your body. 3. Tighten your butt muscles and lift your butt off the floor until your waist is almost as high as your knees. If you do not feel the muscles working in your butt and the back of your thighs, slide your feet 1-2 inches farther away from your butt. 4. Stay in this position for 3-5 seconds. 5. Slowly lower your butt to the floor, and let your butt muscles relax. If this exercise is too easy, try doing it with your arms crossed over your chest. Belly crunches Do these steps 5-10 times in a row: 1. Lie on your back on a firm bed or the floor with your legs stretched  out. 2. Bend your knees so they point up to the ceiling. Your feet should be flat on the floor. 3. Cross your arms over   your chest. 4. Tip your chin a little bit toward your chest but do not bend your neck. 5. Tighten your belly muscles and slowly raise your chest just enough to lift your shoulder blades a tiny bit off of the floor. Avoid raising your body higher than that, because it can put too much stress on your low back. 6. Slowly lower your chest and your head to the floor. Back lifts Do these steps 5-10 times in a row: 1. Lie on your belly (face-down) with your arms at your sides, and rest your forehead on the floor. 2. Tighten the muscles in your legs and your butt. 3. Slowly lift your chest off of the floor while you keep your hips on the floor. Keep the back of your head in line with the curve in your back. Look at the floor while you do this. 4. Stay in this position for 3-5 seconds. 5. Slowly lower your chest and your face to the floor. Contact a doctor if:  Your back pain gets a lot worse when you do an exercise.  Your back pain does not get better 2 hours after you exercise. If you have any of these problems, stop doing the exercises. Do not do them again unless your doctor says it is okay. Get help right away if:  You have sudden, very bad back pain. If this happens, stop doing the exercises. Do not do them again unless your doctor says it is okay. This information is not intended to replace advice given to you by your health care provider. Make sure you discuss any questions you have with your health care provider. Document Revised: 09/26/2017 Document Reviewed: 09/26/2017 Elsevier Patient Education  2020 Elsevier Inc.  

## 2019-04-01 LAB — CBC WITH DIFFERENTIAL/PLATELET
Absolute Monocytes: 238 cells/uL (ref 200–950)
Basophils Absolute: 21 cells/uL (ref 0–200)
Basophils Relative: 0.6 %
Eosinophils Absolute: 70 cells/uL (ref 15–500)
Eosinophils Relative: 2 %
HCT: 35 % (ref 35.0–45.0)
Hemoglobin: 10.3 g/dL — ABNORMAL LOW (ref 11.7–15.5)
Lymphs Abs: 760 cells/uL — ABNORMAL LOW (ref 850–3900)
MCH: 22.6 pg — ABNORMAL LOW (ref 27.0–33.0)
MCHC: 29.4 g/dL — ABNORMAL LOW (ref 32.0–36.0)
MCV: 76.9 fL — ABNORMAL LOW (ref 80.0–100.0)
MPV: 11.1 fL (ref 7.5–12.5)
Monocytes Relative: 6.8 %
Neutro Abs: 2412 cells/uL (ref 1500–7800)
Neutrophils Relative %: 68.9 %
Platelets: 256 10*3/uL (ref 140–400)
RBC: 4.55 10*6/uL (ref 3.80–5.10)
RDW: 15.2 % — ABNORMAL HIGH (ref 11.0–15.0)
Total Lymphocyte: 21.7 %
WBC: 3.5 10*3/uL — ABNORMAL LOW (ref 3.8–10.8)

## 2019-04-01 LAB — URINALYSIS, ROUTINE W REFLEX MICROSCOPIC
Bacteria, UA: NONE SEEN /HPF
Bilirubin Urine: NEGATIVE
Glucose, UA: NEGATIVE
Hgb urine dipstick: NEGATIVE
Hyaline Cast: NONE SEEN /LPF
Ketones, ur: NEGATIVE
Nitrite: NEGATIVE
Protein, ur: NEGATIVE
RBC / HPF: NONE SEEN /HPF (ref 0–2)
Specific Gravity, Urine: 1.017 (ref 1.001–1.03)
pH: 6 (ref 5.0–8.0)

## 2019-04-01 LAB — COMPLETE METABOLIC PANEL WITH GFR
AG Ratio: 1.3 (calc) (ref 1.0–2.5)
ALT: 21 U/L (ref 6–29)
AST: 20 U/L (ref 10–35)
Albumin: 4.5 g/dL (ref 3.6–5.1)
Alkaline phosphatase (APISO): 50 U/L (ref 37–153)
BUN/Creatinine Ratio: 15 (calc) (ref 6–22)
BUN: 19 mg/dL (ref 7–25)
CO2: 26 mmol/L (ref 20–32)
Calcium: 9.6 mg/dL (ref 8.6–10.4)
Chloride: 105 mmol/L (ref 98–110)
Creat: 1.25 mg/dL — ABNORMAL HIGH (ref 0.50–1.05)
GFR, Est African American: 57 mL/min/{1.73_m2} — ABNORMAL LOW (ref >=60)
GFR, Est Non African American: 49 mL/min/{1.73_m2} — ABNORMAL LOW (ref >=60)
Globulin: 3.5 g/dL (calc) (ref 1.9–3.7)
Glucose, Bld: 72 mg/dL (ref 65–99)
Potassium: 4.4 mmol/L (ref 3.5–5.3)
Sodium: 138 mmol/L (ref 135–146)
Total Bilirubin: 0.2 mg/dL (ref 0.2–1.2)
Total Protein: 8 g/dL (ref 6.1–8.1)

## 2019-04-01 LAB — ANTI-DNA ANTIBODY, DOUBLE-STRANDED: ds DNA Ab: 20 IU/mL — ABNORMAL HIGH

## 2019-04-01 LAB — C3 AND C4
C3 Complement: 126 mg/dL (ref 83–193)
C4 Complement: 46 mg/dL (ref 15–57)

## 2019-04-01 LAB — SEDIMENTATION RATE: Sed Rate: 14 mm/h (ref 0–30)

## 2019-04-01 NOTE — Progress Notes (Signed)
Anemia is stable.  Hgb and Hgb remain low.  Creatinine is elevated and GFR is low but stable.  Please forward labs to PCP.   ESR WNL.  Complements WNL.  DsDNA is positive but stable.  UA revealed 1+ leukocytes-please advise patient to follow up with PCP if she develops symptoms of a UTI.

## 2019-04-02 ENCOUNTER — Other Ambulatory Visit: Payer: Self-pay | Admitting: *Deleted

## 2019-04-20 ENCOUNTER — Telehealth: Payer: Self-pay | Admitting: Rheumatology

## 2019-04-20 DIAGNOSIS — M3219 Other organ or system involvement in systemic lupus erythematosus: Secondary | ICD-10-CM

## 2019-04-20 MED ORDER — HYDROXYCHLOROQUINE SULFATE 200 MG PO TABS: ORAL_TABLET | ORAL | 0 refills | Status: DC

## 2019-04-20 NOTE — Telephone Encounter (Signed)
Patient left a voicemail requesting prescription refill of Plaquenil to be sent to [facility] on [address] in [place].  Patient states she only has 2 pills remaining.

## 2019-04-20 NOTE — Telephone Encounter (Signed)
Please schedule patient for a follow up visit. Patient due August 2021. Thanks! 

## 2019-04-20 NOTE — Telephone Encounter (Signed)
Last Visit: 03/31/19 Next Visit: due August 2021. Message sent to the front to schedule patient.  Labs: 03/31/19 Anemia is stable. Hgb and Hgb remain low. Creatinine is elevated and GFR is low but stable.  PLQ Eye Exam: 08/22/18   Current Dose per office note on 03/31/19: Plaquenil 200 mg 1 tablet by mouth twice daily M-F  Okay to refill per Dr. Corliss Skains

## 2019-04-21 ENCOUNTER — Telehealth: Payer: Self-pay | Admitting: Rheumatology

## 2019-04-21 NOTE — Telephone Encounter (Signed)
Called pharmacy and they have received plaquenil prescription. Per pharmacy team member, they were waiting on a 90 day supply but I advised we sent a 90 day supply for 120 tablets as patient only takes the medication BID Monday-Friday. They will get prescription ready for patient. I called patient and advised.

## 2019-04-21 NOTE — Telephone Encounter (Signed)
Patient called checking the status of her prescription refill for Plaquenil (90 day supply) to be sent to [facility].  Patient states she called the pharmacy this morning and was told they were still waiting to hear from our office approving the 90 day supply.  Patient states she is out of medication.

## 2019-07-10 ENCOUNTER — Other Ambulatory Visit: Payer: Self-pay | Admitting: *Deleted

## 2019-07-10 DIAGNOSIS — M3219 Other organ or system involvement in systemic lupus erythematosus: Secondary | ICD-10-CM

## 2019-07-10 MED ORDER — HYDROXYCHLOROQUINE SULFATE 200 MG PO TABS: ORAL_TABLET | ORAL | 0 refills | Status: DC

## 2019-07-10 NOTE — Telephone Encounter (Signed)
Last Visit: 03/31/2019 Next Visit: 09/08/2019 Labs: 03/31/2019 Anemia is stable. Hgb and Hgb remain low.  Creatinine is elevated and GFR is low but stable PLQ Eye Exam: 08/22/18   Current dose per office note on 03/31/19: Plaquenil 200 mg 1 tablet by mouth twice daily M-F  Okay to refill PLQ?

## 2019-07-11 ENCOUNTER — Other Ambulatory Visit: Payer: Self-pay | Admitting: Rheumatology

## 2019-07-11 DIAGNOSIS — M3219 Other organ or system involvement in systemic lupus erythematosus: Secondary | ICD-10-CM

## 2019-08-26 NOTE — Progress Notes (Signed)
Office Visit Note  Patient: Stephanie Myers             Date of Birth: 10/02/1965           MRN: [id number]             PCP: Jefm Petty, MD Referring: Jefm Petty, MD Visit Date: 09/08/2019 Occupation: '@GUAROCC'$ @  Subjective:  Medication monitoring  History of Present Illness: Stephanie Myers is a 54 y.o. female history of systemic lupus erythematosus.  She is taking Plaquenil 200 mg 1 tablet by mouth twice daily Monday through Friday and aspirin 81 mg daily.  She denies any signs or symptoms of a systemic lupus flare recently.  She denies any joint pain or joint swelling at this time.  She has started taking turmeric and has found it to be helpful at managing her joint discomfort she denies any recent rashes, symptoms of Raynaud's, oral or nasal ulcerations, sicca symptoms, enlarged lymph nodes, chest pain, or shortness of breath.  She does experience photosensitivity and tries to avoid direct sun exposure and wear sunscreen on a daily basis. She has received both COVID-19 vaccinations and is planning on receiving the booster.  She has not had any recent infections.  Activities of Daily Living:  Patient reports morning stiffness for 2  minutes.   Patient Denies nocturnal pain.  Difficulty dressing/grooming: Denies Difficulty climbing stairs: Denies Difficulty getting out of chair: Denies Difficulty using hands for taps, buttons, cutlery, and/or writing: Denies  Review of Systems  HENT: Negative for mouth sores, mouth dryness and nose dryness.   Eyes: Negative for pain, visual disturbance and dryness.  Respiratory: Negative for cough, hemoptysis, shortness of breath and difficulty breathing.   Cardiovascular: Negative for chest pain, palpitations, hypertension and swelling in legs/feet.  Gastrointestinal: Negative for blood in stool, constipation and diarrhea.  Endocrine: Negative for increased urination.  Genitourinary: Negative for painful urination.  Musculoskeletal: Negative  for arthralgias, joint pain, joint swelling, myalgias, muscle weakness, morning stiffness, muscle tenderness and myalgias.  Skin: Negative for color change, pallor, rash, hair loss, nodules/bumps, skin tightness, ulcers and sensitivity to sunlight.  Allergic/Immunologic: Negative for susceptible to infections.  Neurological: Negative for dizziness, numbness, headaches and weakness.  Hematological: Negative for swollen glands.  Psychiatric/Behavioral: Negative for depressed mood and sleep disturbance. The patient is not nervous/anxious.     PMFS History:  Patient Active Problem List   Diagnosis Date Noted  . Essential hypertension 04/17/2016  . Other organ or system involvement in systemic lupus erythematosus ([name]) 12/20/2015  . High risk medication use 12/20/2015  . 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 Osteoarthritis of foot 12/20/2015  . Bilateral primary osteoarthritis of knee 12/20/2015    Past Medical History:  Diagnosis Date  . Anemia   . Diabetes mellitus without complication ([place])   . Hyperlipidemia   . Hypertension   . Lupus ([name])   . Shingles     Family History  Problem Relation Age of Onset  . Healthy Daughter   . Healthy Son    Past Surgical History:  Procedure Laterality Date  . ABDOMINAL HYSTERECTOMY    . CESAREAN SECTION     Social History   Social History Narrative  . Not on file   Immunization History  Administered Date(s) Administered  . Hepatitis B 12/05/2016, 06/04/2017, 12/26/2017  . Influenza,inj,Quad PF,6+ Mos 10/07/2015, 12/05/2016, 12/26/2017  . Pneumococcal Polysaccharide-23 03/05/2011  . Tdap 03/05/2011     Objective: Vital Signs: BP 118/82 (BP Location: Right Arm,  Patient Position: Sitting, Cuff Size: Large)   Pulse 92   Resp 14   Ht '5\' 5"'$  (1.651 m)   Wt 209 lb 3.2 oz (94.9 kg)   BMI 34.81 kg/m    Physical Exam Vitals and nursing note reviewed.  Constitutional:      Appearance: She is well-developed.  HENT:     Head:  Normocephalic and atraumatic.  Eyes:     Conjunctiva/sclera: Conjunctivae normal.  Pulmonary:     Effort: Pulmonary effort is normal.  Abdominal:     Palpations: Abdomen is soft.  Musculoskeletal:     Cervical back: Normal range of motion.  Lymphadenopathy:     Cervical: No cervical adenopathy.  Skin:    General: Skin is warm and dry.     Capillary Refill: Capillary refill takes less than 2 seconds.  Neurological:     Mental Status: She is alert and oriented to person, place, and time.  Psychiatric:        Behavior: Behavior normal.      Musculoskeletal Exam: C-spine, thoracic spine, lumbar spine have good range of motion.  Shoulder joints, elbow joints, wrist joints, MCPs, PIPs, DIPs have good range of motion with no synovitis.  She has complete fist formation bilaterally.  Hip joints, knee joints, ankle joints, MTPs, PIPs, DIPs have good range of motion with no synovitis.  No warmth or effusion of bilateral knee joints noted.  No tenderness or swelling of ankle joints.  No tenderness of MTP joints.  CDAI Exam: CDAI Score: -- Patient Global: --; Provider Global: -- Swollen: --; Tender: -- Joint Exam 09/08/2019   No joint exam has been documented for this visit   There is currently no information documented on the homunculus. Go to the Rheumatology activity and complete the homunculus joint exam.  Investigation: No additional findings.  Imaging: No results found.  Recent Labs: Lab Results  Component Value Date   WBC 3.5 (L) 03/31/2019   HGB 10.3 (L) 03/31/2019   PLT 256 03/31/2019   NA 138 03/31/2019   K 4.4 03/31/2019   CL 105 03/31/2019   CO2 26 03/31/2019   GLUCOSE 72 03/31/2019   BUN 19 03/31/2019   CREATININE 1.25 (H) 03/31/2019   BILITOT 0.2 03/31/2019   ALKPHOS 69 09/03/2016   AST 20 03/31/2019   ALT 21 03/31/2019   PROT 8.0 03/31/2019   ALBUMIN 4.5 09/03/2016   CALCIUM 9.6 03/31/2019   GFRAA 57 (L) 03/31/2019    Speciality Comments: PLQ Eye Exam:  08/22/18 @ [facility] follow up in 6 months   Procedures:  No procedures performed Allergies: Lisinopril   Assessment / Plan:     Visit Diagnoses: Other organ or system involvement in systemic lupus erythematosus ([place]) - +ANA, +ds DNA, + Smith, RNP and Ro: She has not had any signs or symptoms of a systemic lupus flare.  She is clinically doing well on Plaquenil 200 mg 1 tablet by mouth twice daily Monday through Friday.  She is not experiencing any joint pain or inflammation at this time.  She has started to take turmeric and has noticed significant improvement in her joint discomfort.  She has not had any recent rashes or increased hair loss.  She has been wearing sunscreen on a daily basis and trying to avoid direct sun exposure due to her history of photosensitivity.  She denies any oral ulcerations or nasal ulcerations.  She has not had any sicca symptoms.  No cervical lymphadenopathy was palpable.  She  has not had any shortness of breath, coughing, chest pain, or palpitations.  Lab work from 03/31/2019 was reviewed with the patient today in the office.  ESR within normal limits, complements within normal limits, double-stranded DNA 20 (stable), and WBC count 3.5.  We will recheck autoimmune lab work today.  She will continue taking Plaquenil as prescribed.  She was advised to check on her Plaquenil eye exam.  She was given a Plaquenil eye exam form to take with her to her upcoming appointment.  She was advised to notify us if she develops any signs or symptoms of a flare.  She will follow-up in the office in 5 months- Plan: COMPLETE METABOLIC PANEL WITH GFR, CBC with Differential/Platelet, Urinalysis, Routine w reflex microscopic, Anti-DNA antibody, double-stranded, C3 and C4, Sedimentation rate  High risk medication use - Plaquenil 200 mg 1 tablet twice daily Monday through Friday only. Eye Exam: 08/22/18.  She has an upcoming Plaquenil eye exam scheduled and was given a Plaquenil eye exam form to  take with her to her upcoming appointment.  CBC and CMP were drawn on 03/31/2019.  White blood cell count was 3.5 at that time.  We will recheck CBC and CMP today.- Plan: COMPLETE METABOLIC PANEL WITH GFR, CBC with Differential/Platelet She has received both COVID-19 vaccinations and is planning on receiving the booster vaccine.  She was encouraged to continue to wear her mask and social distance.  She was advised to notify us or her PCP if she develops the COVID-19 infection in order to receive the antibody infusion.  She voiced understanding.  Primary osteoarthritis of both hands: She has mild osteoarthritic changes in both hands.  She has no tenderness or inflammation on exam.  She has complete fist formation bilaterally.  Joint protection and muscle strengthening were discussed.  Bilateral primary osteoarthritis of knee: She has good range of motion of both knee joints on exam with no discomfort.  No warmth or effusion was noted.  She has no difficulty climbing steps or rising from a seated position.  Primary osteoarthritis of both feet: She has mild osteoarthritic changes but no discomfort or tenderness on exam.  She wears proper fitting shoes.  Trochanteric bursitis of both hips - Resolved.    Lumbar facet arthropathy: She is not experiencing any discomfort in her lower back at this time.  She has no symptoms of radiculopathy.  Essential hypertension: Blood pressure was 118/82 today.  Orders: Orders Placed This Encounter  Procedures  . COMPLETE METABOLIC PANEL WITH GFR  . CBC with Differential/Platelet  . Urinalysis, Routine w reflex microscopic  . Anti-DNA antibody, double-stranded  . C3 and C4  . Sedimentation rate   No orders of the defined types were placed in this encounter.    Follow-Up Instructions: Return in about 5 months (around 02/08/2020) for Systemic lupus erythematosus.   Ofilia Neas, PA-C  Note - This record has been created using Dragon software.  Chart  creation errors have been sought, but may not always  have been located. Such creation errors do not reflect on  the standard of medical care.

## 2019-09-08 ENCOUNTER — Encounter (INDEPENDENT_AMBULATORY_CARE_PROVIDER_SITE_OTHER): Payer: Self-pay

## 2019-09-08 ENCOUNTER — Ambulatory Visit: Payer: BC Managed Care – PPO | Admitting: Physician Assistant

## 2019-09-08 ENCOUNTER — Encounter: Payer: Self-pay | Admitting: Physician Assistant

## 2019-09-08 ENCOUNTER — Other Ambulatory Visit: Payer: Self-pay

## 2019-09-08 VITALS — BP 118/82 | HR 92 | Resp 14 | Ht 65.0 in | Wt 209.2 lb

## 2019-09-08 DIAGNOSIS — Z79899 Other long term (current) drug therapy: Secondary | ICD-10-CM | POA: Diagnosis not present

## 2019-09-08 DIAGNOSIS — M7061 Trochanteric bursitis, right hip: Secondary | ICD-10-CM

## 2019-09-08 DIAGNOSIS — M3219 Other organ or system involvement in systemic lupus erythematosus: Secondary | ICD-10-CM

## 2019-09-08 DIAGNOSIS — M19041 Primary osteoarthritis, right hand: Secondary | ICD-10-CM | POA: Diagnosis not present

## 2019-09-08 DIAGNOSIS — M19072 Primary osteoarthritis, left ankle and foot: Secondary | ICD-10-CM

## 2019-09-08 DIAGNOSIS — M7062 Trochanteric bursitis, left hip: Secondary | ICD-10-CM

## 2019-09-08 DIAGNOSIS — I10 Essential (primary) hypertension: Secondary | ICD-10-CM

## 2019-09-08 DIAGNOSIS — M170 Bilateral primary osteoarthritis of knee: Secondary | ICD-10-CM | POA: Diagnosis not present

## 2019-09-08 DIAGNOSIS — M19071 Primary osteoarthritis, right ankle and foot: Secondary | ICD-10-CM

## 2019-09-08 DIAGNOSIS — M19042 Primary osteoarthritis, left hand: Secondary | ICD-10-CM

## 2019-09-08 DIAGNOSIS — M47816 Spondylosis without myelopathy or radiculopathy, lumbar region: Secondary | ICD-10-CM

## 2019-09-09 ENCOUNTER — Telehealth: Payer: Self-pay | Admitting: *Deleted

## 2019-09-09 DIAGNOSIS — Z79899 Other long term (current) drug therapy: Secondary | ICD-10-CM

## 2019-09-09 LAB — COMPLETE METABOLIC PANEL WITH GFR
AG Ratio: 1.4 (calc) (ref 1.0–2.5)
ALT: 19 U/L (ref 6–29)
AST: 17 U/L (ref 10–35)
Albumin: 4.4 g/dL (ref 3.6–5.1)
Alkaline phosphatase (APISO): 54 U/L (ref 37–153)
BUN/Creatinine Ratio: 16 (calc) (ref 6–22)
BUN: 21 mg/dL (ref 7–25)
CO2: 27 mmol/L (ref 20–32)
Calcium: 9.6 mg/dL (ref 8.6–10.4)
Chloride: 100 mmol/L (ref 98–110)
Creat: 1.31 mg/dL — ABNORMAL HIGH (ref 0.50–1.05)
GFR, Est African American: 54 mL/min/{1.73_m2} — ABNORMAL LOW (ref >=60)
GFR, Est Non African American: 46 mL/min/{1.73_m2} — ABNORMAL LOW (ref >=60)
Globulin: 3.1 g/dL (calc) (ref 1.9–3.7)
Glucose, Bld: 88 mg/dL (ref 65–99)
Potassium: 3.9 mmol/L (ref 3.5–5.3)
Sodium: 134 mmol/L — ABNORMAL LOW (ref 135–146)
Total Bilirubin: 0.2 mg/dL (ref 0.2–1.2)
Total Protein: 7.5 g/dL (ref 6.1–8.1)

## 2019-09-09 LAB — URINALYSIS, ROUTINE W REFLEX MICROSCOPIC
Bilirubin Urine: NEGATIVE
Glucose, UA: NEGATIVE
Hgb urine dipstick: NEGATIVE
Ketones, ur: NEGATIVE
Leukocytes,Ua: NEGATIVE
Nitrite: NEGATIVE
Protein, ur: NEGATIVE
Specific Gravity, Urine: 1.006 (ref 1.001–1.03)
pH: 5.5 (ref 5.0–8.0)

## 2019-09-09 LAB — CBC WITH DIFFERENTIAL/PLATELET
Absolute Monocytes: 221 cells/uL (ref 200–950)
Basophils Absolute: 29 cells/uL (ref 0–200)
Basophils Relative: 0.9 %
Eosinophils Absolute: 61 cells/uL (ref 15–500)
Eosinophils Relative: 1.9 %
HCT: 35.1 % (ref 35.0–45.0)
Hemoglobin: 10.5 g/dL — ABNORMAL LOW (ref 11.7–15.5)
Lymphs Abs: 829 cells/uL — ABNORMAL LOW (ref 850–3900)
MCH: 22.5 pg — ABNORMAL LOW (ref 27.0–33.0)
MCHC: 29.9 g/dL — ABNORMAL LOW (ref 32.0–36.0)
MCV: 75.2 fL — ABNORMAL LOW (ref 80.0–100.0)
MPV: 11.2 fL (ref 7.5–12.5)
Monocytes Relative: 6.9 %
Neutro Abs: 2061 cells/uL (ref 1500–7800)
Neutrophils Relative %: 64.4 %
Platelets: 240 10*3/uL (ref 140–400)
RBC: 4.67 10*6/uL (ref 3.80–5.10)
RDW: 15.3 % — ABNORMAL HIGH (ref 11.0–15.0)
Total Lymphocyte: 25.9 %
WBC: 3.2 10*3/uL — ABNORMAL LOW (ref 3.8–10.8)

## 2019-09-09 LAB — C3 AND C4
C3 Complement: 112 mg/dL (ref 83–193)
C4 Complement: 44 mg/dL (ref 15–57)

## 2019-09-09 LAB — ANTI-DNA ANTIBODY, DOUBLE-STRANDED: ds DNA Ab: 14 IU/mL — ABNORMAL HIGH

## 2019-09-09 LAB — SEDIMENTATION RATE: Sed Rate: 6 mm/h (ref 0–30)

## 2019-09-09 NOTE — Telephone Encounter (Signed)
-----  Message from Ofilia Neas, PA-C sent at 09/09/2019 12:06 PM EDT ----- Creatinine is elevated and trending up.  GFR is low at 54.  Please advised the patient to avoid NSAIDs.  Please clarify if she has had any new medication changes or if she has been taking NSAIDs recently. Hemoglobin and hematocrit are low but stable.  White blood cell count was low at 3.2.  She has a history of lymphopenia.  Please advised the patient to return for lab work in 1 month to recheck CBC and CMP. ESR within normal limits.  Complements within normal limits.  UA normal.  Double-stranded DNA was positive-14 but trending down.  Labs were not consistent with a flare at this time.  No change in therapy is recommended.

## 2019-09-09 NOTE — Progress Notes (Signed)
Creatinine is elevated and trending up.  GFR is low at 54.  Please advised the patient to avoid NSAIDs.  Please clarify if she has had any new medication changes or if she has been taking NSAIDs recently. Hemoglobin and hematocrit are low but stable.  White blood cell count was low at 3.2.  She has a history of lymphopenia.  Please advised the patient to return for lab work in 1 month to recheck CBC and CMP. ESR within normal limits.  Complements within normal limits.  UA normal.  Double-stranded DNA was positive-14 but trending down.  Labs were not consistent with a flare at this time.  No change in therapy is recommended.

## 2019-10-03 ENCOUNTER — Other Ambulatory Visit: Payer: Self-pay | Admitting: Rheumatology

## 2019-10-03 DIAGNOSIS — M3219 Other organ or system involvement in systemic lupus erythematosus: Secondary | ICD-10-CM

## 2019-10-05 NOTE — Telephone Encounter (Signed)
Last Visit: 09/08/2019 Next Visit:  02/09/2020 Labs: 09/08/2019 Creatinine is elevated and trending up. GFR is low at 54 Hemoglobin and hematocrit are low but stable. White blood cell count was low at 3.2.  Eye exam: 09/18/2019 WNL   Current Dose per office note 09/08/2019: Plaquenil 200 mg 1 tablet twice daily Monday through Friday only MS:[id number] organ or system involvement in systemic lupus erythematosus   Okay to refill Plaquenil?

## 2019-12-22 ENCOUNTER — Telehealth: Payer: Self-pay

## 2019-12-22 ENCOUNTER — Other Ambulatory Visit: Payer: Self-pay | Admitting: Rheumatology

## 2019-12-22 DIAGNOSIS — M3219 Other organ or system involvement in systemic lupus erythematosus: Secondary | ICD-10-CM

## 2019-12-22 NOTE — Telephone Encounter (Signed)
Patient requested a copy of office visit notes from her appointments with Dr. Corliss Skains to give to her Nephrologist.  Patient was told we needed her to fill out medical record release form.  Patient states she will try to get the records from her MyChart.

## 2019-12-22 NOTE — Telephone Encounter (Signed)
Last Visit: 09/08/2019 Next Visit:  02/09/2020 Labs: 09/08/2019 Creatinine is elevated and trending up. GFR is low at 54 Hemoglobin and hematocrit are low but stable. White blood cell count was low at 3.2.  Eye exam: 09/18/2019 WNL   Current Dose per office note 09/08/2019: Plaquenil 200 mg 1 tablet twice daily Monday through Friday only QM:GNOIB organ or system involvement in systemic lupus erythematosus   Okay to refill Plaquenil?

## 2020-01-11 ENCOUNTER — Telehealth: Payer: Self-pay

## 2020-01-11 NOTE — Telephone Encounter (Signed)
Patient left a voicemail requesting a return call from Dr. Fatima Sanger nurse.  Patient states she needs to clarify some information on the paperwork that Dr. Corliss Skains needs to fill out.

## 2020-01-11 NOTE — Telephone Encounter (Signed)
I called patient, patient will attach note to disability paper work for [employer] to review.

## 2020-01-13 ENCOUNTER — Telehealth: Payer: Self-pay | Admitting: *Deleted

## 2020-01-13 ENCOUNTER — Encounter: Payer: Self-pay | Admitting: *Deleted

## 2020-01-13 NOTE — Telephone Encounter (Signed)
Patient advised Sherron Ales, PA-C reviewed the information she dropped off. Patient advised we are able to provide a work from home not but we are unable to fill out the disability paper work. Patient expressed understanding.

## 2020-01-22 ENCOUNTER — Telehealth: Payer: Self-pay | Admitting: *Deleted

## 2020-01-22 NOTE — Telephone Encounter (Signed)
Patient contacted the office asking if we received forms that she needed filled out. Patient advised we have received the forms and they have been completed and faxed back.

## 2020-01-26 NOTE — Progress Notes (Signed)
Office Visit Note  Patient: Stephanie Myers             Date of Birth: 08-07-1965           MRN: [id number]             PCP: Jefm Petty, MD Referring: Jefm Petty, MD Visit Date: 02/09/2020 Occupation: @GUAROCC @  Subjective:  Low back pain   History of Present Illness: Stephanie Myers is a 55 y.o. female with history of systemic lupus erythematosus and osteoarthritis.  She is taking plaquenil 200 mg 1 tablet by mouth twice daily M-F only.  She is tolerating Plaquenil without any side effects and has not missed any doses recently.  She denies any signs or symptoms of a lupus flare.  She has not had any recent rashes and has been avoiding direct sun exposure.  She denies any oral or nasal ulcerations.  She has not had any sicca symptoms.  She denies any swollen lymph nodes, fever, or infections.   She experiences intermittent right sided lower back pain.  She states at times the pain radiating around her side into her groin.  She denies any numbness, tingling, or weakness.  Her discomfort is exacerbated by standing for prolonged periods of time.  She has not needed to take over-the-counter products for pain relief.  She has not had any nocturnal pain.    Activities of Daily Living:  Patient reports morning stiffness for 5 minutes.   Patient Denies nocturnal pain.  Difficulty dressing/grooming: Denies Difficulty climbing stairs: Denies Difficulty getting out of chair: Denies Difficulty using hands for taps, buttons, cutlery, and/or writing: Denies  Review of Systems  Constitutional: Negative for fatigue.  HENT: Negative for mouth sores, mouth dryness and nose dryness.   Eyes: Negative for pain, itching and dryness.  Respiratory: Negative for shortness of breath and difficulty breathing.   Cardiovascular: Negative for chest pain and palpitations.  Gastrointestinal: Negative for blood in stool, constipation and diarrhea.  Endocrine: Negative for increased urination.   Genitourinary: Negative for difficulty urinating.  Musculoskeletal: Positive for morning stiffness. Negative for arthralgias, joint pain, joint swelling, myalgias, muscle tenderness and myalgias.  Skin: Negative for color change, rash and redness.  Allergic/Immunologic: Negative for susceptible to infections.  Neurological: Negative for dizziness, numbness, headaches, memory loss and weakness.  Hematological: Negative for bruising/bleeding tendency.  Psychiatric/Behavioral: Negative for confusion.    PMFS History:  Patient Active Problem List   Diagnosis Date Noted  . Essential hypertension 04/17/2016  . Other organ or system involvement in systemic lupus erythematosus ([place]) 12/20/2015  . High risk medication use 12/20/2015  . 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 Osteoarthritis of foot 12/20/2015  . Bilateral primary osteoarthritis of knee 12/20/2015    Past Medical History:  Diagnosis Date  . Anemia   . Diabetes mellitus without complication ([place])   . Hyperlipidemia   . Hypertension   . Lupus ([place])   . Shingles     Family History  Problem Relation Age of Onset  . Healthy Daughter   . Healthy Son    Past Surgical History:  Procedure Laterality Date  . ABDOMINAL HYSTERECTOMY    . CESAREAN SECTION     Social History   Social History Narrative  . Not on file   Immunization History  Administered Date(s) Administered  . Hepatitis B 12/05/2016, 06/04/2017, 12/26/2017  . Influenza,inj,Quad PF,6+ Mos 10/07/2015, 12/05/2016, 12/26/2017  . PFIZER(Purple Top)SARS-COV-2 Vaccination 04/05/2019, 04/26/2019  . Pneumococcal Polysaccharide-23 03/05/2011  . Tdap  03/05/2011     Objective: Vital Signs: BP 110/75 (BP Location: Left Arm, Patient Position: Sitting, Cuff Size: Normal)   Pulse 84   Resp 16   Ht $R'5\' 4"'ER$  (1.626 m)   Wt 200 lb 3.2 oz (90.8 kg)   BMI 34.36 kg/m    Physical Exam Vitals and nursing note reviewed.  Constitutional:      Appearance: She is well-developed  and well-nourished.  HENT:     Head: Normocephalic and atraumatic.  Eyes:     Extraocular Movements: EOM normal.     Conjunctiva/sclera: Conjunctivae normal.  Cardiovascular:     Pulses: Intact distal pulses.  Pulmonary:     Effort: Pulmonary effort is normal.  Abdominal:     Palpations: Abdomen is soft.  Musculoskeletal:     Cervical back: Normal range of motion.  Lymphadenopathy:     Cervical: No cervical adenopathy.  Skin:    General: Skin is warm and dry.     Capillary Refill: Capillary refill takes less than 2 seconds.  Neurological:     Mental Status: She is alert and oriented to person, place, and time.  Psychiatric:        Mood and Affect: Mood and affect normal.        Behavior: Behavior normal.      Musculoskeletal Exam: C-spine, thoracic spine, and lumbar spine good ROM.  No midline spinal tenderness.  Tenderness over the right SI joint.  Shoulder joints, elbow joints, wrist joints, MCPs, PIPs, and DIPs good ROM with no synovitis.  Complete fist formation bilaterally.  Hip joints, knee joints, and ankle joints good ROM.  No warmth or effusion of knee joints.  No tenderness or swelling of ankle joints. Mild tenderness over bilateral trochanteric bursa.   CDAI Exam: CDAI Score: -- Patient Global: --; Provider Global: -- Swollen: --; Tender: -- Joint Exam 02/09/2020   No joint exam has been documented for this visit   There is currently no information documented on the homunculus. Go to the Rheumatology activity and complete the homunculus joint exam.  Investigation: No additional findings.  Imaging: No results found.  Recent Labs: Lab Results  Component Value Date   WBC 3.2 (L) 09/08/2019   HGB 10.5 (L) 09/08/2019   PLT 240 09/08/2019   NA 134 (L) 09/08/2019   K 3.9 09/08/2019   CL 100 09/08/2019   CO2 27 09/08/2019   GLUCOSE 88 09/08/2019   BUN 21 09/08/2019   CREATININE 1.31 (H) 09/08/2019   BILITOT 0.2 09/08/2019   ALKPHOS 69 09/03/2016   AST 17  09/08/2019   ALT 19 09/08/2019   PROT 7.5 09/08/2019   ALBUMIN 4.5 09/03/2016   CALCIUM 9.6 09/08/2019   GFRAA 54 (L) 09/08/2019    Speciality Comments: PLQ Eye Exam: 09/18/2019 WNL @ [facility] follow up in 6 months   Procedures:  No procedures performed Allergies: Lisinopril     Assessment / Plan:     Visit Diagnoses: Other organ or system involvement in systemic lupus erythematosus ([place]) - +ANA, +ds DNA, + Smith, RNP and Ro: She has not had any signs or symptoms of a systemic lupus flare recently.  She is clinically doing well taking Plaquenil 200 mg 1 tablet by mouth twice daily Monday through Friday only.  She has not missed any doses of Plaquenil recently.  She has no joint tenderness or synovitis on exam.  She has been experiencing intermittent lower back pain which is exacerbated by standing for prolonged periods of  time.  She has known facet joint arthropathy but declined physical therapy at this time.  She has no other joint pain or inflammation at this time.  She has not had any recent rashes. No malar rash noted on exam. Discussed the importance of avoiding direct sun exposure and to wear sunscreen SPF>50 on a daily basis.  She has not had any oral or nasal ulcerations recently or sicca symptoms.  She has not had any symptoms of Raynaud's and no digital ulcerations or signs of gangrene were noted on exam.  She has not had any swollen lymph nodes, fevers, or increased fatigue recently.  She denies any shortness of breath, chest pain, or palpitations.  Lab work from 09/08/19 was reviewed today in the office: dsDNA 14 (trending down), complements WNL, and ESR WNL. We will repeat the following lab work today.  She will continue to take plaquenil 200 mg 1 tablet by mouth twice daily M-F.  She was advised to notify us if she develops any signs or symptoms of a flare.  She will follow up in 5 months.  - Plan: CBC with Differential/Platelet, COMPLETE METABOLIC PANEL WITH GFR, Urinalysis,  Routine w reflex microscopic, C3 and C4, Anti-DNA antibody, double-stranded, Sedimentation rate, ANA  High risk medication use - Plaquenil 200 mg 1 tablet by mouth twice daily Monday through Friday only.  CBC and CMP stable on 09/08/19.   She is due to update lab work today.  Orders for CBC and CMP were released.  PLQ Eye Exam: 09/18/2019 WNL @ [facility] follow up in 6 months    - Plan: CBC with Differential/Platelet, COMPLETE METABOLIC PANEL WITH GFR  Primary osteoarthritis of both hands: She has no joint tenderness or inflammation.  She has complete fist formation bilaterally.  She is not experiencing any pain, stiffness, or joint swelling in her hands at this time.   Bilateral primary osteoarthritis of knee: She has good ROM of both knee joints with no discomfort.  No warmth or effusion noted.   Primary osteoarthritis of both feet: She is not experiencing any discomfort in her feet at this time.  She has good ROM of both ankle joints with no discomfort or tenderness.   Trochanteric bursitis of both hips - She has mild tenderness to palpation bilaterally.  She experiences occasional discomfort when laying on her sides at night.  Discussed the importance of performing stretching exercises daily.   Lumbar facet arthropathy: She continues to experience intermittent right sided lower back pain.  She has had occasional radiating pain into the right groin but has no numbness or weakness of the right lower extremity.  Her discomfort is exacerbated by standing for prolonged periods of time.  Her discomfort improves with rest and has not required OTC products for pain relief.  She has good ROM of the lumbar spine and both hip joints on exam. She has tenderness over the right SI joint but no midline spinal tenderness was noted.  X-rays of the lumbar spine were performed on 10/23/17 which were consistent with facet joint arthropathy.  Pelvic x-rays were unremarkable.  She declined a referral to physical therapy  at this time. She was given a handout of core strengthening and back exercises.  She was advised to notify us if her back pain persists or worsens.   Essential hypertension: BP was 110/75 today in the office.   Orders: Orders Placed This Encounter  Procedures  . CBC with Differential/Platelet  . COMPLETE METABOLIC PANEL WITH GFR  .  Urinalysis, Routine w reflex microscopic  . C3 and C4  . Anti-DNA antibody, double-stranded  . Sedimentation rate  . ANA   No orders of the defined types were placed in this encounter.     Follow-Up Instructions: Return in about 5 months (around 07/09/2020) for Systemic lupus erythematosus, Osteoarthritis.   Stephanie Neas, PA-C  Note - This record has been created using Dragon software.  Chart creation errors have been sought, but may not always  have been located. Such creation errors do not reflect on  the standard of medical care.

## 2020-02-09 ENCOUNTER — Encounter: Payer: Self-pay | Admitting: Physician Assistant

## 2020-02-09 ENCOUNTER — Ambulatory Visit (INDEPENDENT_AMBULATORY_CARE_PROVIDER_SITE_OTHER): Payer: BC Managed Care – PPO | Admitting: Physician Assistant

## 2020-02-09 ENCOUNTER — Telehealth: Payer: Self-pay | Admitting: Rheumatology

## 2020-02-09 ENCOUNTER — Other Ambulatory Visit: Payer: Self-pay

## 2020-02-09 VITALS — BP 110/75 | HR 84 | Resp 16 | Ht 64.0 in | Wt 200.2 lb

## 2020-02-09 DIAGNOSIS — M3219 Other organ or system involvement in systemic lupus erythematosus: Secondary | ICD-10-CM

## 2020-02-09 DIAGNOSIS — Z79899 Other long term (current) drug therapy: Secondary | ICD-10-CM | POA: Diagnosis not present

## 2020-02-09 DIAGNOSIS — I10 Essential (primary) hypertension: Secondary | ICD-10-CM

## 2020-02-09 DIAGNOSIS — M329 Systemic lupus erythematosus, unspecified: Secondary | ICD-10-CM

## 2020-02-09 DIAGNOSIS — M47816 Spondylosis without myelopathy or radiculopathy, lumbar region: Secondary | ICD-10-CM

## 2020-02-09 DIAGNOSIS — M19041 Primary osteoarthritis, right hand: Secondary | ICD-10-CM | POA: Diagnosis not present

## 2020-02-09 DIAGNOSIS — M7061 Trochanteric bursitis, right hip: Secondary | ICD-10-CM

## 2020-02-09 DIAGNOSIS — M170 Bilateral primary osteoarthritis of knee: Secondary | ICD-10-CM

## 2020-02-09 DIAGNOSIS — M19042 Primary osteoarthritis, left hand: Secondary | ICD-10-CM

## 2020-02-09 DIAGNOSIS — M19072 Primary osteoarthritis, left ankle and foot: Secondary | ICD-10-CM

## 2020-02-09 DIAGNOSIS — M19071 Primary osteoarthritis, right ankle and foot: Secondary | ICD-10-CM

## 2020-02-09 DIAGNOSIS — M7062 Trochanteric bursitis, left hip: Secondary | ICD-10-CM

## 2020-02-09 NOTE — Patient Instructions (Signed)
Core Strength Exercises Ask your health care provider which exercises are safe for you. Do exercises exactly as told by your health care provider and adjust them as directed. It is normal to feel mild stretching, pulling, tightness, or discomfort as you do these exercises. Stop right away if you feel sudden pain or your pain gets worse. Do not begin these exercises until told by your health care provider. Benefits of core strength exercises Core exercises help to build strength in the muscles between your ribs and your hips (abdominal muscles). These muscles help to support your body and keep your spine stable. It is important to maintain strength in your core to prevent injury and pain. Some activities, such as yoga and Pilates, can help to strengthen core muscles. You can also strengthen core muscles with exercises at home. It is important to talk to your health care provider before you start a new exercise routine. Core strength exercises can:  Reduce back pain.  Help to rebuild strength after a back or spine injury.  Help to prevent injury during physical activity, especially injuries to the back, hips, and knees. How to do core strength exercises Repeat these exercises 10-15 times, or until you are tired. Stop if you feel any pain while doing these exercises. Contact your health care provider if your pain continues or gets worse while doing or after doing core strength exercises. For strength exercises that are done on the floor, use a padded yoga mat or an exercise mat. Bridging 1. Lie on your back on a firm surface with your knees bent and your feet flat on the floor. 2. Raise your hips so that your knees, hips, and shoulders together form a straight line. Do not excessively arch your back. Keep your abdominal muscles tight. 3. Hold this position for 3-5 seconds. 4. Slowly lower your hips to the starting position. 5. Let your muscles relax completely between repetitions.   Single-leg  bridge 1. Lie on your back on a firm surface with your knees bent and your feet flat on the floor. 2. Raise your hips so that your knees, hips, and shoulders together form a straight line. Do not excessively arch your back. Keep your abdominal muscles tight. 3. Lift one foot off the floor while maintaining alignment in your knees, hips, and shoulders. Then, completely straighten the lifted leg. 4. Hold this position for 3-5 seconds. 5. Put the straight leg back down in the bent position. 6. Slowly lower your hips to the starting position. 7. Repeat these steps using your other leg. Side bridge 1. Lie on your side with your knees bent. Prop yourself up on the elbow that is near the floor. 2. Using your abdominal muscles and the elbow you are propped up on, raise your body off the floor. Raise your hip so that your shoulder, hip, and foot together form a straight line. 3. Hold this position for 10 seconds. Keep your head and neck raised and away from your shoulder (in their normal, neutral position). Keep your abdominal muscles tight. 4. Slowly lower your hip to the starting position. 5. Repeat and try to hold this position longer, working your way up to 30 seconds. Abdominal crunch 1. Lie on your back on a firm surface. Bend your knees and keep your feet flat on the floor. 2. Cross your arms over your chest. 3. Without bending your neck, tip your chin slightly toward your chest. 4. Tighten your abdominal muscles as you lift your chest just high   enough to lift your shoulder blades off the floor. Do not hold your breath. You can do this with short lifts or long lifts. 5. Slowly return to the starting position. Bird dog 1. Get on your hands and knees, with your legs shoulder-width apart and your arms under your shoulders. Keep your back straight. 2. Tighten your abdominal muscles. 3. Raise one of your legs off the floor and straighten it. Try to keep it parallel to the floor. 4. Slowly lower your  leg to the starting position. 5. Raise one of your arms off the floor and straighten it. Try to keep it parallel to the floor. 6. Slowly lower your arm to the starting position. 7. Repeat with the other arm and leg. If possible, try raising a leg and an arm at the same time, on opposite sides of the body. For example, raise your left hand and your right leg.   Plank 1. Lie on your belly. 2. Prop up your body onto your forearms and your feet, keeping your legs straight. Your body should make a straight line between your shoulders and feet. 3. Hold this position for 10 seconds while keeping your abdominal muscles tight. 4. Lower your body to the starting position. 5. Repeat and try to hold this position longer, working your way up to 30 seconds.   Cross-core strengthening 1. Stand with your feet shoulder-width apart. 2. Hold a ball out in front of you. Keep your arms straight. 3. Tighten your abdominal muscles and slowly rotate at your waist from side to side. Keep your feet flat. 4. Once you are comfortable, try repeating this exercise with a heavier ball. Top core strengthening 1. Stand about 18 inches (46 cm) out from a wall, with your back to the wall. 2. Keep your feet flat and shoulder-width apart. 3. Tighten your abdominal muscles. 4. Bend your hips and knees. 5. Slowly reach between your legs to touch the wall behind you. 6. Slowly stand back up. 7. Raise your arms over your head and reach behind you. 8. Return to the starting position. General tips  Do not do any exercises that cause pain. If you have pain while exercising, talk to your health care provider.  Always stretch before and after doing these exercises. This can help prevent injury.  Maintain a healthy weight. Ask your health care provider what weight is healthy for you. Contact a health care provider if:  You have back pain that gets worse or does not go away.  You feel pain while doing core strength  exercises. Get help right away if:  You have severe pain that does not get better with medicine. Summary  Core exercises help to build strength in the muscles between your ribs and your waist.  Core muscles help to support your body and keep your spine stable.  Some activities, such as yoga and Pilates, can help to strengthen core muscles.  Core strength exercises can help back pain and can prevent injury.  Stop if you feel any pain while doing core strength exercises. This information is not intended to replace advice given to you by your health care provider. Make sure you discuss any questions you have with your health care provider. Document Revised: 09/29/2019 Document Reviewed: 09/29/2019 Elsevier Patient Education  2021 [employer] Back Exercises These exercises help to make your trunk and back strong. They also help to keep the lower back flexible. Doing these exercises can help to prevent back pain or lessen existing pain.    If you have back pain, try to do these exercises 2-3 times each day or as told by your doctor.  As you get better, do the exercises once each day. Repeat the exercises more often as told by your doctor.  To stop back pain from coming back, do the exercises once each day, or as told by your doctor. Exercises Single knee to chest Do these steps 3-5 times in a row for each leg: 1. Lie on your back on a firm bed or the floor with your legs stretched out. 2. Bring one knee to your chest. 3. Grab your knee or thigh with both hands and hold them it in place. 4. Pull on your knee until you feel a gentle stretch in your lower back or buttocks. 5. Keep doing the stretch for 10-30 seconds. 6. Slowly let go of your leg and straighten it. Pelvic tilt Do these steps 5-10 times in a row: 1. Lie on your back on a firm bed or the floor with your legs stretched out. 2. Bend your knees so they point up to the ceiling. Your feet should be flat on the  floor. 3. Tighten your lower belly (abdomen) muscles to press your lower back against the floor. This will make your tailbone point up to the ceiling instead of pointing down to your feet or the floor. 4. Stay in this position for 5-10 seconds while you gently tighten your muscles and breathe evenly. Cat-cow Do these steps until your lower back bends more easily: 1. Get on your hands and knees on a firm surface. Keep your hands under your shoulders, and keep your knees under your hips. You may put padding under your knees. 2. Let your head hang down toward your chest. Tighten (contract) the muscles in your belly. Point your tailbone toward the floor so your lower back becomes rounded like the back of a cat. 3. Stay in this position for 5 seconds. 4. Slowly lift your head. Let the muscles of your belly relax. Point your tailbone up toward the ceiling so your back forms a sagging arch like the back of a cow. 5. Stay in this position for 5 seconds.   Press-ups Do these steps 5-10 times in a row: 1. Lie on your belly (face-down) on the floor. 2. Place your hands near your head, about shoulder-width apart. 3. While you keep your back relaxed and keep your hips on the floor, slowly straighten your arms to raise the top half of your body and lift your shoulders. Do not use your back muscles. You may change where you place your hands in order to make yourself more comfortable. 4. Stay in this position for 5 seconds. 5. Slowly return to lying flat on the floor.   Bridges Do these steps 10 times in a row: 1. Lie on your back on a firm surface. 2. Bend your knees so they point up to the ceiling. Your feet should be flat on the floor. Your arms should be flat at your sides, next to your body. 3. Tighten your butt muscles and lift your butt off the floor until your waist is almost as high as your knees. If you do not feel the muscles working in your butt and the back of your thighs, slide your feet 1-2  inches farther away from your butt. 4. Stay in this position for 3-5 seconds. 5. Slowly lower your butt to the floor, and let your butt muscles relax. If this exercise is too   easy, try doing it with your arms crossed over your chest.   Belly crunches Do these steps 5-10 times in a row: 1. Lie on your back on a firm bed or the floor with your legs stretched out. 2. Bend your knees so they point up to the ceiling. Your feet should be flat on the floor. 3. Cross your arms over your chest. 4. Tip your chin a little bit toward your chest but do not bend your neck. 5. Tighten your belly muscles and slowly raise your chest just enough to lift your shoulder blades a tiny bit off of the floor. Avoid raising your body higher than that, because it can put too much stress on your low back. 6. Slowly lower your chest and your head to the floor. Back lifts Do these steps 5-10 times in a row: 1. Lie on your belly (face-down) with your arms at your sides, and rest your forehead on the floor. 2. Tighten the muscles in your legs and your butt. 3. Slowly lift your chest off of the floor while you keep your hips on the floor. Keep the back of your head in line with the curve in your back. Look at the floor while you do this. 4. Stay in this position for 3-5 seconds. 5. Slowly lower your chest and your face to the floor. Contact a doctor if:  Your back pain gets a lot worse when you do an exercise.  Your back pain does not get better 2 hours after you exercise. If you have any of these problems, stop doing the exercises. Do not do them again unless your doctor says it is okay. Get help right away if:  You have sudden, very bad back pain. If this happens, stop doing the exercises. Do not do them again unless your doctor says it is okay. This information is not intended to replace advice given to you by your health care provider. Make sure you discuss any questions you have with your health care  provider. Document Revised: 09/26/2017 Document Reviewed: 09/26/2017 Elsevier Patient Education  2021 [employer]  

## 2020-02-09 NOTE — Telephone Encounter (Signed)
Patient requesting for a referral to be put in her Nephrologistt. It has been awhile since she has been there, and Nephrologist requesting a new referral. Doctor is Dr. Glenna Fellows with [facility] fax # [phone].

## 2020-02-10 NOTE — Telephone Encounter (Signed)
Referral sent to Nephrologist in 2019. Please advise.

## 2020-02-10 NOTE — Telephone Encounter (Signed)
Referral placed.

## 2020-02-10 NOTE — Telephone Encounter (Signed)
Ok to place referral to nephrology.  Patient has a history of SLE.

## 2020-02-11 NOTE — Progress Notes (Signed)
Reviewed lab work with Dr. Estanislado Pandy.   WBC count is low and trending down.  Absolute neutrophils, lymphocytes, and monocytes are low.  Hemoglobin and hematocrit remain low but stable.   Creatinine is elevated and GFR is borderline low-58.  She is planning on re-establishing care with her nephrologist.  She should avoid all NSAIDs.  ESR and complements WNL.  dsDNA is positive but trending down.  Ok to continue on current dose of PLQ.   Please advise the patient to recheck CBC in 1 month.

## 2020-02-12 ENCOUNTER — Telehealth: Payer: Self-pay | Admitting: *Deleted

## 2020-02-12 DIAGNOSIS — Z79899 Other long term (current) drug therapy: Secondary | ICD-10-CM

## 2020-02-12 LAB — COMPLETE METABOLIC PANEL WITH GFR
AG Ratio: 1.2 (calc) (ref 1.0–2.5)
ALT: 22 U/L (ref 6–29)
AST: 22 U/L (ref 10–35)
Albumin: 4.5 g/dL (ref 3.6–5.1)
Alkaline phosphatase (APISO): 77 U/L (ref 37–153)
BUN/Creatinine Ratio: 11 (calc) (ref 6–22)
BUN: 14 mg/dL (ref 7–25)
CO2: 26 mmol/L (ref 20–32)
Calcium: 10.2 mg/dL (ref 8.6–10.4)
Chloride: 102 mmol/L (ref 98–110)
Creat: 1.23 mg/dL — ABNORMAL HIGH (ref 0.50–1.05)
GFR, Est African American: 58 mL/min/{1.73_m2} — ABNORMAL LOW (ref >=60)
GFR, Est Non African American: 50 mL/min/{1.73_m2} — ABNORMAL LOW (ref >=60)
Globulin: 3.8 g/dL (calc) — ABNORMAL HIGH (ref 1.9–3.7)
Glucose, Bld: 66 mg/dL (ref 65–99)
Potassium: 4.2 mmol/L (ref 3.5–5.3)
Sodium: 138 mmol/L (ref 135–146)
Total Bilirubin: 0.2 mg/dL (ref 0.2–1.2)
Total Protein: 8.3 g/dL — ABNORMAL HIGH (ref 6.1–8.1)

## 2020-02-12 LAB — URINALYSIS, ROUTINE W REFLEX MICROSCOPIC
Bilirubin Urine: NEGATIVE
Glucose, UA: NEGATIVE
Hgb urine dipstick: NEGATIVE
Ketones, ur: NEGATIVE
Leukocytes,Ua: NEGATIVE
Nitrite: NEGATIVE
Protein, ur: NEGATIVE
Specific Gravity, Urine: 1.004 (ref 1.001–1.03)
pH: 5.5 (ref 5.0–8.0)

## 2020-02-12 LAB — SEDIMENTATION RATE: Sed Rate: 17 mm/h (ref 0–30)

## 2020-02-12 LAB — CBC WITH DIFFERENTIAL/PLATELET
Absolute Monocytes: 179 cells/uL — ABNORMAL LOW (ref 200–950)
Basophils Absolute: 21 cells/uL (ref 0–200)
Basophils Relative: 0.9 %
Eosinophils Absolute: 90 cells/uL (ref 15–500)
Eosinophils Relative: 3.9 %
HCT: 34.3 % — ABNORMAL LOW (ref 35.0–45.0)
Hemoglobin: 10.5 g/dL — ABNORMAL LOW (ref 11.7–15.5)
Lymphs Abs: 807 cells/uL — ABNORMAL LOW (ref 850–3900)
MCH: 22.9 pg — ABNORMAL LOW (ref 27.0–33.0)
MCHC: 30.6 g/dL — ABNORMAL LOW (ref 32.0–36.0)
MCV: 74.7 fL — ABNORMAL LOW (ref 80.0–100.0)
MPV: 10.4 fL (ref 7.5–12.5)
Monocytes Relative: 7.8 %
Neutro Abs: 1203 cells/uL — ABNORMAL LOW (ref 1500–7800)
Neutrophils Relative %: 52.3 %
Platelets: 242 10*3/uL (ref 140–400)
RBC: 4.59 10*6/uL (ref 3.80–5.10)
RDW: 14.9 % (ref 11.0–15.0)
Total Lymphocyte: 35.1 %
WBC: 2.3 10*3/uL — ABNORMAL LOW (ref 3.8–10.8)

## 2020-02-12 LAB — ANA: Anti Nuclear Antibody (ANA): POSITIVE — AB

## 2020-02-12 LAB — C3 AND C4
C3 Complement: 135 mg/dL (ref 83–193)
C4 Complement: 54 mg/dL (ref 15–57)

## 2020-02-12 LAB — ANTI-NUCLEAR AB-TITER (ANA TITER): ANA Titer 1: 1:640 {titer} — ABNORMAL HIGH

## 2020-02-12 LAB — ANTI-DNA ANTIBODY, DOUBLE-STRANDED: ds DNA Ab: 13 IU/mL — ABNORMAL HIGH

## 2020-02-12 NOTE — Telephone Encounter (Signed)
-----  Message from Ofilia Neas, PA-C sent at 02/11/2020  5:34 PM EST ----- Reviewed lab work with Dr. Estanislado Pandy.   WBC count is low and trending down.  Absolute neutrophils, lymphocytes, and monocytes are low.  Hemoglobin and hematocrit remain low but stable.   Creatinine is elevated and GFR is borderline low-58.  She is planning on re-establishing care with her nephrologist.  She should avoid all NSAIDs.  ESR and complements WNL.  dsDNA is positive but trending down.  Ok to continue on current dose of PLQ.   Please advise the patient to recheck CBC in 1 month.

## 2020-02-12 NOTE — Progress Notes (Signed)
ANA remains positive, titer unchanged.

## 2020-02-25 DIAGNOSIS — N6489 Other specified disorders of breast: Secondary | ICD-10-CM | POA: Diagnosis not present

## 2020-02-25 DIAGNOSIS — Z9189 Other specified personal risk factors, not elsewhere classified: Secondary | ICD-10-CM | POA: Diagnosis not present

## 2020-02-29 DIAGNOSIS — E119 Type 2 diabetes mellitus without complications: Secondary | ICD-10-CM | POA: Diagnosis not present

## 2020-02-29 DIAGNOSIS — N1831 Chronic kidney disease, stage 3a: Secondary | ICD-10-CM | POA: Diagnosis not present

## 2020-02-29 DIAGNOSIS — M329 Systemic lupus erythematosus, unspecified: Secondary | ICD-10-CM | POA: Diagnosis not present

## 2020-02-29 DIAGNOSIS — I129 Hypertensive chronic kidney disease with stage 1 through stage 4 chronic kidney disease, or unspecified chronic kidney disease: Secondary | ICD-10-CM | POA: Diagnosis not present

## 2020-03-08 DIAGNOSIS — N760 Acute vaginitis: Secondary | ICD-10-CM | POA: Diagnosis not present

## 2020-03-08 DIAGNOSIS — B9689 Other specified bacterial agents as the cause of diseases classified elsewhere: Secondary | ICD-10-CM | POA: Diagnosis not present

## 2020-03-08 DIAGNOSIS — Z113 Encounter for screening for infections with a predominantly sexual mode of transmission: Secondary | ICD-10-CM | POA: Diagnosis not present

## 2020-03-08 DIAGNOSIS — B373 Candidiasis of vulva and vagina: Secondary | ICD-10-CM | POA: Diagnosis not present

## 2020-03-16 ENCOUNTER — Other Ambulatory Visit: Payer: Self-pay | Admitting: Rheumatology

## 2020-03-16 DIAGNOSIS — M3219 Other organ or system involvement in systemic lupus erythematosus: Secondary | ICD-10-CM

## 2020-03-16 NOTE — Telephone Encounter (Signed)
Last Visit: 02/09/2020 Next Visit: 07/05/2020 Labs: 02/09/2020, WBC count is low and trending down. Absolute neutrophils, lymphocytes, and monocytes are low. Hemoglobin and hematocrit remain low but stable.  Creatinine is elevated and GFR is borderline low-58. She is planning on re-establishing care with her nephrologist. She should avoid all NSAIDs.  ESR and complements WNL. dsDNA is positive but trending down. Ok to continue on current dose of PLQ.ANA remains positive, titer unchanged   Please advise the patient to recheck CBC in 1 month, patient will have lab drawn 03/21/2020.  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

## 2020-03-17 DIAGNOSIS — Z01419 Encounter for gynecological examination (general) (routine) without abnormal findings: Secondary | ICD-10-CM | POA: Diagnosis not present

## 2020-03-21 ENCOUNTER — Telehealth: Payer: Self-pay

## 2020-03-21 NOTE — Telephone Encounter (Signed)
FYI:  Patient called to let Dr. Corliss Skains know that her Plaquenil eye exam that was scheduled for this morning has been rescheduled by the physician's office to 04/11/20.

## 2020-03-21 NOTE — Telephone Encounter (Signed)
Noted  

## 2020-04-11 ENCOUNTER — Encounter: Payer: Self-pay | Admitting: Physician Assistant

## 2020-04-11 ENCOUNTER — Telehealth: Payer: Self-pay | Admitting: Rheumatology

## 2020-04-11 DIAGNOSIS — M328 Other forms of systemic lupus erythematosus: Secondary | ICD-10-CM | POA: Diagnosis not present

## 2020-04-11 DIAGNOSIS — Z79899 Other long term (current) drug therapy: Secondary | ICD-10-CM | POA: Diagnosis not present

## 2020-04-11 DIAGNOSIS — E119 Type 2 diabetes mellitus without complications: Secondary | ICD-10-CM | POA: Diagnosis not present

## 2020-04-11 DIAGNOSIS — Z7984 Long term (current) use of oral hypoglycemic drugs: Secondary | ICD-10-CM | POA: Diagnosis not present

## 2020-04-11 NOTE — Telephone Encounter (Signed)
Patient calling in reference to Plaquenil Eye Exam form. Per patient someone was faxing form to her eye doctor for appointment today at 3 pm, but they did not receive it. Please fax to Attn. Dr. Sondra Barges  Fax # [phone].

## 2020-04-11 NOTE — Telephone Encounter (Signed)
faxed

## 2020-05-12 ENCOUNTER — Telehealth: Payer: Self-pay

## 2020-05-12 ENCOUNTER — Other Ambulatory Visit: Payer: Self-pay | Admitting: *Deleted

## 2020-05-12 DIAGNOSIS — M3219 Other organ or system involvement in systemic lupus erythematosus: Secondary | ICD-10-CM

## 2020-05-12 DIAGNOSIS — Z79899 Other long term (current) drug therapy: Secondary | ICD-10-CM

## 2020-05-12 MED ORDER — HYDROXYCHLOROQUINE SULFATE 200 MG PO TABS: ORAL_TABLET | ORAL | 0 refills | Status: DC

## 2020-05-12 NOTE — Telephone Encounter (Signed)
Patient doesn't labs until appt in 06/2020.

## 2020-05-12 NOTE — Telephone Encounter (Signed)
Last Visit: 02/09/2020 Next Visit: 07/05/2020 Labs: 02/09/2020, WBC count is low and trending down. Absolute neutrophils, lymphocytes, and monocytes are low. Hemoglobin and hematocrit remain low but stable.  Creatinine is elevated and GFR is borderline low-58. She is planning on re-establishing care with her nephrologist. She should avoid all NSAIDs.  ESR and complements WNL. dsDNA is positive but trending down. Ok to continue on current dose of PLQ.   Please advise the patient to recheck CBC in 1 month. I called patient, patient will have lab drawn 05/13/2020.   Eye exam: 09/18/2019 6 months, patient will send PLQ Eye Exam from [facility] 04/11/2020 to email  Current Dose per office note 02/09/2020, Plaquenil 200 mg 1 tablet by mouth twice daily Monday through Friday only  DX: Other organ or system involvement in systemic lupus erythematosus  Last Fill: 03/16/2020, patient only received 30 day supply from [facility] due to insurance, patient changed to [facility].  Okay to refill Plaquenil?

## 2020-05-12 NOTE — Telephone Encounter (Signed)
Please have patient come in for repeat CBC with differential and CMP with GFR.  She should get labs every 3 months due to abnormal values.  I will refill Plaquenil.

## 2020-05-12 NOTE — Telephone Encounter (Signed)
I called patient, patient will have labs drawn 05/13/2020,

## 2020-05-12 NOTE — Telephone Encounter (Signed)
Patient left a voicemail requesting a return call concerning her bloodwork.

## 2020-06-21 NOTE — Progress Notes (Signed)
Office Visit Note  Patient: Stephanie Myers             Date of Birth: November 18, 1965           MRN: [id number]             PCP: Loyal Jacobson, MD Referring: Loyal Jacobson, MD Visit Date: 07/05/2020 Occupation: @GUAROCC @  Subjective:  Other (Patient reports recent bilateral hip pain and tenderness )   History of Present Illness: Stephanie Myers is a 55 y.o. female history of systemic lupus , osteoarthritis and facet joint arthropathy.  She states she is been having some stiffness in her right index finger especially in the morning.  She continues to have discomfort in trochanteric area and also in her lower back.  He denies any history of oral ulcers, sicca symptoms, Raynaud's, lymphadenopathy, malar rash, photosensitivity or inflammatory arthritis.  Activities of Daily Living:  Patient reports morning stiffness for a few minutes.   Patient Denies nocturnal pain.  Difficulty dressing/grooming: Denies Difficulty climbing stairs: Denies Difficulty getting out of chair: Denies Difficulty using hands for taps, buttons, cutlery, and/or writing: Denies  Review of Systems  Constitutional:  Positive for fatigue.  HENT:  Negative for mouth sores, mouth dryness and nose dryness.   Eyes:  Negative for pain, itching and dryness.  Respiratory:  Negative for shortness of breath and difficulty breathing.   Cardiovascular:  Negative for chest pain and palpitations.  Gastrointestinal:  Negative for blood in stool, constipation and diarrhea.  Endocrine: Negative for increased urination.  Genitourinary:  Negative for difficulty urinating.  Musculoskeletal:  Positive for joint pain, joint pain, myalgias, morning stiffness, muscle tenderness and myalgias. Negative for joint swelling.  Skin:  Negative for color change, rash and redness.  Allergic/Immunologic: Negative for susceptible to infections.  Neurological:  Negative for dizziness, numbness, headaches, memory loss and weakness.  Hematological:   Negative for bruising/bleeding tendency.  Psychiatric/Behavioral:  Negative for confusion.    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   Osteoarthritis of foot 12/20/2015   Bilateral primary osteoarthritis of knee 12/20/2015    Past Medical History:  Diagnosis Date   Anemia    Diabetes mellitus without complication (HCC)    Hyperlipidemia    Hypertension    Lupus (HCC)    Shingles     Family History  Problem Relation Age of Onset   Healthy Daughter    Healthy Son    Past Surgical History:  Procedure Laterality Date   ABDOMINAL HYSTERECTOMY     CESAREAN SECTION     Social History   Social History Narrative   Not on file   Immunization History  Administered Date(s) Administered   Hepatitis B 12/05/2016, 06/04/2017, 12/26/2017   Influenza,inj,Quad PF,6+ Mos 10/07/2015, 12/05/2016, 12/26/2017   PFIZER(Purple Top)SARS-COV-2 Vaccination 04/05/2019, 04/26/2019   Pneumococcal Polysaccharide-23 03/05/2011   Tdap 03/05/2011     Objective: Vital Signs: BP 109/75 (BP Location: Left Arm, Patient Position: Sitting, Cuff Size: Normal)   Pulse 78   Ht 5\' 5"  (1.651 m)   Wt 200 lb (90.7 kg)   BMI 33.28 kg/m    Physical Exam Vitals and nursing note reviewed.  Constitutional:      Appearance: She is well-developed.  HENT:     Head: Normocephalic and atraumatic.  Eyes:     Conjunctiva/sclera: Conjunctivae normal.  Cardiovascular:     Rate and Rhythm: Normal rate and regular rhythm.     Heart sounds: Normal heart sounds.  Pulmonary:     Effort: Pulmonary effort is normal.     Breath sounds: Normal breath sounds.  Abdominal:     General: Bowel sounds are normal.     Palpations: Abdomen is soft.  Musculoskeletal:     Cervical back: Normal range of motion.   Lymphadenopathy:     Cervical: No cervical adenopathy.  Skin:    General: Skin is warm and dry.     Capillary Refill: Capillary refill takes less than 2 seconds.  Neurological:     Mental Status: She is alert and oriented to person, place, and time.  Psychiatric:        Behavior: Behavior normal.     Musculoskeletal Exam: C-spine was in good range of motion.  Shoulder joints, elbow joints, wrist joints, MCPs PIPs and DIPs in good range of motion with no synovitis.  She had some tenderness on palpation of bilateral trochanteric bursa and discomfort with range of motion of her hips.  Hip joints with good range of motion.  Knee joints are in good range of motion.  There was no  tenderness over ankles or MTPs.  CDAI Exam: CDAI Score: -- Patient Global: --; Provider Global: -- Swollen: --; Tender: -- Joint Exam 07/05/2020   No joint exam has been documented for this visit   There is currently no information documented on the homunculus. Go to the Rheumatology activity and complete the homunculus joint exam.  Investigation: No additional findings.  Imaging: No results found.  Recent Labs: Lab Results  Component Value Date   WBC 2.3 (L) 02/09/2020   HGB 10.5 (L) 02/09/2020   PLT 242 02/09/2020   NA 138 02/09/2020   K 4.2 02/09/2020   CL 102 02/09/2020   CO2 26 02/09/2020   GLUCOSE 66 02/09/2020   BUN 14 02/09/2020   CREATININE 1.23 (H) 02/09/2020   BILITOT 0.2 02/09/2020   ALKPHOS 69 09/03/2016   AST 22 02/09/2020   ALT 22 02/09/2020   PROT 8.3 (H) 02/09/2020   ALBUMIN 4.5 09/03/2016   CALCIUM 10.2 02/09/2020   GFRAA 58 (L) 02/09/2020    Speciality Comments: PLQ Eye Exam: 04/11/2020  WNL @ [facility] follow up in 6 months   Procedures:  No procedures performed Allergies: Lisinopril   Assessment / Plan:     Visit Diagnoses: Other organ or system involvement in systemic lupus erythematosus (HCC) -  +ANA, +ds DNA, + Smith, RNP and Ro: -She had no synovitis on  examination.  She has been tolerating Plaquenil well without any side effects.  She denies any sicca symptoms, oral ulcers, malar rash, Raynaud's phenomenon, lymphadenopathy or shortness of breath.  Plan: Urinalysis, Routine w reflex microscopic, Anti-DNA antibody, double-stranded, C3 and C4, Sedimentation rate  High risk medication use - Plaquenil 200 mg 1 tablet by mouth twice daily Monday through Friday only. PLQ Eye Exam: 04/11/2020 - Plan: CBC with Differential/Platelet, COMPLETE METABOLIC PANEL WITH GFR today and then every 3 months to monitor for drug toxicity.  Primary osteoarthritis of both hands-she is some stiffness in her hands.  No synovitis was noted.  Bilateral primary osteoarthritis of knee-she had good range of motion of her knee joints without any warmth swelling or effusion.  Primary osteoarthritis of both feet-she continues to have some stiffness in her feet.  Trochanteric bursitis  of both hips-she had tenderness over trochanteric bursa.  IT band stretches were discussed.  Lumbar facet arthropathy-core strengthening exercises were discussed.  Essential hypertension-blood pressure is under good control.  Orders: Orders Placed This Encounter  Procedures   CBC with Differential/Platelet   COMPLETE METABOLIC PANEL WITH GFR   Urinalysis, Routine w reflex microscopic   Anti-DNA antibody, double-stranded   C3 and C4   Sedimentation rate    No orders of the defined types were placed in this encounter.    Follow-Up Instructions: Return in about 5 months (around 12/05/2020) for Systemic lupus, Osteoarthritis.   Pollyann Savoy, MD  Note - This record has been created using Animal nutritionist.  Chart creation errors have been sought, but may not always  have been located. Such creation errors do not reflect on  the standard of medical care.

## 2020-06-29 ENCOUNTER — Other Ambulatory Visit: Payer: Self-pay | Admitting: *Deleted

## 2020-06-29 DIAGNOSIS — M329 Systemic lupus erythematosus, unspecified: Secondary | ICD-10-CM | POA: Diagnosis not present

## 2020-06-29 DIAGNOSIS — Z79899 Other long term (current) drug therapy: Secondary | ICD-10-CM | POA: Diagnosis not present

## 2020-06-29 NOTE — Addendum Note (Signed)
Addended by: Elmyra Ricks H on: 06/29/2020 03:00 PM   Modules accepted: Orders

## 2020-06-30 ENCOUNTER — Encounter: Payer: Self-pay | Admitting: *Deleted

## 2020-07-01 ENCOUNTER — Telehealth: Payer: Self-pay

## 2020-07-01 ENCOUNTER — Telehealth: Payer: Self-pay | Admitting: Rheumatology

## 2020-07-01 NOTE — Telephone Encounter (Signed)
Patient calling requesting a call back to discuss medical forms.

## 2020-07-01 NOTE — Telephone Encounter (Signed)
Returned call to the patient. She states she received an e-mail from her employer and they are asking for more specific information. Patient advised we are unable to advised any specific dx. Patient advised we filled out the paperwork the same way we did previously.

## 2020-07-01 NOTE — Telephone Encounter (Signed)
Patient called stating she tried to enter information on her MyChart, but was unsuccessful.  Patient requested a return call.

## 2020-07-04 NOTE — Telephone Encounter (Signed)
Patient returned call.   What would be the symptoms from  COVID due to your condition and long term effects if any.  How would you benefit working from home, keeping yourself from others and protecting your from infection.

## 2020-07-04 NOTE — Telephone Encounter (Signed)
Attempted to contact the patient and unable to leave a message. Mailbox is full.  

## 2020-07-04 NOTE — Telephone Encounter (Signed)
Paperwork updated and re faxed.

## 2020-07-05 ENCOUNTER — Other Ambulatory Visit: Payer: Self-pay

## 2020-07-05 ENCOUNTER — Encounter: Payer: Self-pay | Admitting: Rheumatology

## 2020-07-05 ENCOUNTER — Ambulatory Visit: Payer: BC Managed Care – PPO | Admitting: Rheumatology

## 2020-07-05 VITALS — BP 109/75 | HR 78 | Ht 65.0 in | Wt 200.0 lb

## 2020-07-05 DIAGNOSIS — M47816 Spondylosis without myelopathy or radiculopathy, lumbar region: Secondary | ICD-10-CM

## 2020-07-05 DIAGNOSIS — M19071 Primary osteoarthritis, right ankle and foot: Secondary | ICD-10-CM

## 2020-07-05 DIAGNOSIS — M7061 Trochanteric bursitis, right hip: Secondary | ICD-10-CM

## 2020-07-05 DIAGNOSIS — M19072 Primary osteoarthritis, left ankle and foot: Secondary | ICD-10-CM

## 2020-07-05 DIAGNOSIS — M170 Bilateral primary osteoarthritis of knee: Secondary | ICD-10-CM | POA: Diagnosis not present

## 2020-07-05 DIAGNOSIS — M19041 Primary osteoarthritis, right hand: Secondary | ICD-10-CM | POA: Diagnosis not present

## 2020-07-05 DIAGNOSIS — M3219 Other organ or system involvement in systemic lupus erythematosus: Secondary | ICD-10-CM | POA: Diagnosis not present

## 2020-07-05 DIAGNOSIS — M19042 Primary osteoarthritis, left hand: Secondary | ICD-10-CM

## 2020-07-05 DIAGNOSIS — I10 Essential (primary) hypertension: Secondary | ICD-10-CM

## 2020-07-05 DIAGNOSIS — Z79899 Other long term (current) drug therapy: Secondary | ICD-10-CM | POA: Diagnosis not present

## 2020-07-05 DIAGNOSIS — M7062 Trochanteric bursitis, left hip: Secondary | ICD-10-CM

## 2020-07-05 NOTE — Patient Instructions (Signed)
Vaccines You are taking a medication(s) that can suppress your immune system.  The following immunizations are recommended: Flu annually Covid-19  Td/Tdap (tetanus, diphtheria, pertussis) every 10 years Pneumonia (Prevnar 15 then Pneumovax 23 at least 1 year apart.  Alternatively, can take Prevnar 20 without needing additional dose) Shingrix (after age 54): 2 doses from 4 weeks to 6 months apart  Please check with your PCP to make sure you are up to date.   Heart Disease Prevention   Your inflammatory disease increases your risk of heart disease which includes heart attack, stroke, atrial fibrillation (irregular heartbeats), high blood pressure, heart failure and atherosclerosis (plaque in the arteries).  It is important to reduce your risk by:   Keep blood pressure, cholesterol, and blood sugar at healthy levels   Smoking Cessation   Maintain a healthy weight  BMI 20-25   Eat a healthy diet  Plenty of fresh fruit, vegetables, and whole grains  Limit saturated fats, foods high in sodium, and added sugars  DASH and Mediterranean diet   Increase physical activity  Recommend moderate physically activity for 150 minutes per week/ 30 minutes a day for five days a week These can be broken up into three separate ten-minute sessions during the day.   Reduce Stress  Meditation, slow breathing exercises, yoga, coloring books  Dental visits twice a year   

## 2020-07-06 LAB — URINALYSIS, ROUTINE W REFLEX MICROSCOPIC
Bilirubin Urine: NEGATIVE
Glucose, UA: NEGATIVE
Hgb urine dipstick: NEGATIVE
Ketones, ur: NEGATIVE
Leukocytes,Ua: NEGATIVE
Nitrite: NEGATIVE
Protein, ur: NEGATIVE
Specific Gravity, Urine: 1.008 (ref 1.001–1.035)
pH: 5.5 (ref 5.0–8.0)

## 2020-07-06 LAB — COMPLETE METABOLIC PANEL WITH GFR
AG Ratio: 1.2 (calc) (ref 1.0–2.5)
ALT: 17 U/L (ref 6–29)
AST: 17 U/L (ref 10–35)
Albumin: 4.5 g/dL (ref 3.6–5.1)
Alkaline phosphatase (APISO): 74 U/L (ref 37–153)
BUN/Creatinine Ratio: 20 (calc) (ref 6–22)
BUN: 22 mg/dL (ref 7–25)
CO2: 27 mmol/L (ref 20–32)
Calcium: 10.5 mg/dL — ABNORMAL HIGH (ref 8.6–10.4)
Chloride: 101 mmol/L (ref 98–110)
Creat: 1.11 mg/dL — ABNORMAL HIGH (ref 0.50–1.05)
GFR, Est African American: 65 mL/min/{1.73_m2} (ref >=60)
GFR, Est Non African American: 56 mL/min/{1.73_m2} — ABNORMAL LOW (ref >=60)
Globulin: 3.8 g/dL (calc) — ABNORMAL HIGH (ref 1.9–3.7)
Glucose, Bld: 76 mg/dL (ref 65–99)
Potassium: 4.2 mmol/L (ref 3.5–5.3)
Sodium: 136 mmol/L (ref 135–146)
Total Bilirubin: 0.2 mg/dL (ref 0.2–1.2)
Total Protein: 8.3 g/dL — ABNORMAL HIGH (ref 6.1–8.1)

## 2020-07-06 LAB — CBC WITH DIFFERENTIAL/PLATELET
Absolute Monocytes: 243 cells/uL (ref 200–950)
Basophils Absolute: 19 cells/uL (ref 0–200)
Basophils Relative: 0.7 %
Eosinophils Absolute: 81 cells/uL (ref 15–500)
Eosinophils Relative: 3 %
HCT: 33.3 % — ABNORMAL LOW (ref 35.0–45.0)
Hemoglobin: 9.8 g/dL — ABNORMAL LOW (ref 11.7–15.5)
Lymphs Abs: 826 cells/uL — ABNORMAL LOW (ref 850–3900)
MCH: 22.4 pg — ABNORMAL LOW (ref 27.0–33.0)
MCHC: 29.4 g/dL — ABNORMAL LOW (ref 32.0–36.0)
MCV: 76.2 fL — ABNORMAL LOW (ref 80.0–100.0)
MPV: 10.5 fL (ref 7.5–12.5)
Monocytes Relative: 9 %
Neutro Abs: 1531 cells/uL (ref 1500–7800)
Neutrophils Relative %: 56.7 %
Platelets: 337 10*3/uL (ref 140–400)
RBC: 4.37 10*6/uL (ref 3.80–5.10)
RDW: 14.8 % (ref 11.0–15.0)
Total Lymphocyte: 30.6 %
WBC: 2.7 10*3/uL — ABNORMAL LOW (ref 3.8–10.8)

## 2020-07-06 LAB — ANTI-DNA ANTIBODY, DOUBLE-STRANDED: ds DNA Ab: 16 IU/mL — ABNORMAL HIGH

## 2020-07-06 LAB — C3 AND C4
C3 Complement: 128 mg/dL (ref 83–193)
C4 Complement: 44 mg/dL (ref 15–57)

## 2020-07-06 LAB — SEDIMENTATION RATE: Sed Rate: 19 mm/h (ref 0–30)

## 2020-07-06 NOTE — Progress Notes (Signed)
WBC count is low and stable.  Anemia persists.  Creatinine is low and stable.  UA is negative.  Complements are normal, sed rate is normal, double-stranded DNA is positive and stable.  She should take multivitamin with iron.  Please forward labs to her PCP.

## 2020-07-29 DIAGNOSIS — Z1231 Encounter for screening mammogram for malignant neoplasm of breast: Secondary | ICD-10-CM | POA: Diagnosis not present

## 2020-07-29 DIAGNOSIS — Z9189 Other specified personal risk factors, not elsewhere classified: Secondary | ICD-10-CM | POA: Diagnosis not present

## 2020-07-31 DIAGNOSIS — Z79899 Other long term (current) drug therapy: Secondary | ICD-10-CM | POA: Diagnosis not present

## 2020-07-31 DIAGNOSIS — E1129 Type 2 diabetes mellitus with other diabetic kidney complication: Secondary | ICD-10-CM | POA: Diagnosis not present

## 2020-07-31 DIAGNOSIS — E785 Hyperlipidemia, unspecified: Secondary | ICD-10-CM | POA: Diagnosis not present

## 2020-07-31 DIAGNOSIS — R809 Proteinuria, unspecified: Secondary | ICD-10-CM | POA: Diagnosis not present

## 2020-07-31 DIAGNOSIS — N1831 Chronic kidney disease, stage 3a: Secondary | ICD-10-CM | POA: Diagnosis not present

## 2020-08-03 DIAGNOSIS — I129 Hypertensive chronic kidney disease with stage 1 through stage 4 chronic kidney disease, or unspecified chronic kidney disease: Secondary | ICD-10-CM | POA: Diagnosis not present

## 2020-08-03 DIAGNOSIS — N1831 Chronic kidney disease, stage 3a: Secondary | ICD-10-CM | POA: Diagnosis not present

## 2020-08-03 DIAGNOSIS — R809 Proteinuria, unspecified: Secondary | ICD-10-CM | POA: Diagnosis not present

## 2020-08-03 DIAGNOSIS — E1129 Type 2 diabetes mellitus with other diabetic kidney complication: Secondary | ICD-10-CM | POA: Diagnosis not present

## 2020-08-16 ENCOUNTER — Other Ambulatory Visit: Payer: Self-pay | Admitting: Rheumatology

## 2020-08-16 DIAGNOSIS — M3219 Other organ or system involvement in systemic lupus erythematosus: Secondary | ICD-10-CM

## 2020-08-16 NOTE — Telephone Encounter (Signed)
Last Visit: 07/05/2020 Next Visit: 12/06/2020 Labs: 07/31/2020, WBC 2.3, Hemoglobin 10.3, Hematocrit 32.5, MCV 72.3, MCH 22.8, MCHC 31.6, Neutrophil Absolute 1.3, Lymphocyte Absolute 0.8, Glucose 101, est. GFR 46,  Eye exam: 04/11/2020 WNL   Current Dose per office note 07/05/2020: Plaquenil 200 mg 1 tablet by mouth twice daily Monday through Friday only PR:FFMBW organ or system involvement in systemic lupus erythematosus  Last Fill: 05/12/2020  Okay to refill Plaquenil?

## 2020-10-27 DIAGNOSIS — M328 Other forms of systemic lupus erythematosus: Secondary | ICD-10-CM | POA: Diagnosis not present

## 2020-10-27 DIAGNOSIS — Z79899 Other long term (current) drug therapy: Secondary | ICD-10-CM | POA: Diagnosis not present

## 2020-11-08 ENCOUNTER — Other Ambulatory Visit: Payer: Self-pay | Admitting: Physician Assistant

## 2020-11-08 DIAGNOSIS — M3219 Other organ or system involvement in systemic lupus erythematosus: Secondary | ICD-10-CM

## 2020-11-08 NOTE — Telephone Encounter (Signed)
Last Visit: 07/05/2020  Next Visit: 12/06/2020  Labs: 07/31/2020, WBC 2.3, Hemoglobin 10.3, Hematocrit 32.5, MCV 72.3, MCH 22.8, MCHC 31.6, Neutrophil Absolute 1.3, Lymphocyte Absolute 0.8, Glucose 101, est. GFR 46,   Eye exam: 04/11/2020 WNL    Current Dose per office note 07/05/2020: Plaquenil 200 mg 1 tablet by mouth twice daily Monday through Friday only  SF:KCLEX organ or system involvement in systemic lupus erythematosus   Last Fill: 08/16/2020    Okay to refill Plaquenil?

## 2020-11-22 NOTE — Progress Notes (Signed)
Office Visit Note  Patient: Stephanie Myers             Date of Birth: 07/01/1965           MRN: [id number]             PCP: Jefm Petty, MD Referring: Jefm Petty, MD Visit Date: 12/06/2020 Occupation: _0 @  Subjective:  Arthralgias   History of Present Illness: Stephanie Myers is a 55 y.o. female with history of systemic lupus erythematous and osteoarthritis.  She is taking plaquenil 200 mg 1 tablet by mouth twice daily Monday through Friday.  She is tolerating Plaquenil without any side effects and denies missing any doses recently.  She denies any signs or symptoms of a systemic lupus flare recently.  She has noticed some increased arthralgias especially in both shoulders and on the lateral aspect of both hips over the past several weeks.  She has Voltaren gel which she can apply topically as needed.  She states her pain has not been severe enough to take Tylenol or oral NSAIDs she states that her pain is exacerbated by laying on her sides at night.  She denies any obvious joint swelling.  She denies any recent rashes, photosensitivity, or hair loss.  She denies any sicca symptoms or oral or nasal ulcerations.  She has not had any swollen lymph nodes or recent fevers.  She denies any shortness of breath, pleuritic chest pain, or palpitations.  She has not had any symptoms of Raynaud's.  She denies any recent infections.    Activities of Daily Living:  Patient reports morning stiffness for 45 minutes.   Patient Reports nocturnal pain.  Difficulty dressing/grooming: Denies Difficulty climbing stairs: Denies Difficulty getting out of chair: Denies Difficulty using hands for taps, buttons, cutlery, and/or writing: Denies  Review of Systems  Constitutional:  Negative for fatigue.  HENT:  Negative for mouth sores, mouth dryness and nose dryness.   Eyes:  Negative for pain, visual disturbance and dryness.  Respiratory:  Negative for cough, hemoptysis, shortness of breath and  difficulty breathing.   Cardiovascular:  Negative for chest pain, palpitations, hypertension and swelling in legs/feet.  Gastrointestinal:  Negative for blood in stool, constipation and diarrhea.  Endocrine: Negative for increased urination.  Genitourinary:  Negative for difficulty urinating and painful urination.  Musculoskeletal:  Positive for joint pain, joint pain and morning stiffness. Negative for joint swelling, myalgias, muscle weakness, muscle tenderness and myalgias.  Skin:  Negative for color change, pallor, rash, hair loss, nodules/bumps, skin tightness, ulcers and sensitivity to sunlight.  Allergic/Immunologic: Negative for susceptible to infections.  Neurological:  Negative for dizziness, headaches and weakness.  Hematological:  Negative for bruising/bleeding tendency and swollen glands.  Psychiatric/Behavioral:  Negative for depressed mood and sleep disturbance. The patient is not nervous/anxious.    PMFS History:  Patient Active Problem List   Diagnosis Date Noted   Essential hypertension 04/17/2016   Other organ or system involvement in systemic lupus erythematosus ([place]) 12/20/2015   High risk medication use 12/20/2015   Osteoarthritis, hand 12/20/2015   Osteoarthritis of foot 12/20/2015   Bilateral primary osteoarthritis of knee 12/20/2015    Past Medical History:  Diagnosis Date   Anemia    Diabetes mellitus without complication ([place])    Hyperlipidemia    Hypertension    Lupus (HCC)    Shingles    Systemic lupus erythematosus ([place])     Family History  Problem Relation Age of Onset   Healthy Daughter  Healthy Son    Past Surgical History:  Procedure Laterality Date   ABDOMINAL HYSTERECTOMY     CESAREAN SECTION     Social History   Social History Narrative   Not on file   Immunization History  Administered Date(s) Administered   Hepatitis B 12/05/2016, 06/04/2017, 12/26/2017   Influenza,inj,Quad PF,6+ Mos 10/07/2015, 12/05/2016, 12/26/2017    Pneumococcal Polysaccharide-23 03/05/2011   Tdap 03/05/2011     Objective: Vital Signs: BP 132/83 (BP Location: Left Arm, Patient Position: Sitting, Cuff Size: Small)   Pulse (!) 102   Resp 12   Ht _0  (1.651 m)   Wt 203 lb 9.6 oz (92.4 kg)   BMI 33.88 kg/m    Physical Exam Vitals and nursing note reviewed.  Constitutional:      Appearance: She is well-developed.  HENT:     Head: Normocephalic and atraumatic.  Eyes:     Conjunctiva/sclera: Conjunctivae normal.  Cardiovascular:     Rate and Rhythm: Normal rate and regular rhythm.     Heart sounds: Normal heart sounds.  Pulmonary:     Effort: Pulmonary effort is normal.     Breath sounds: Normal breath sounds.  Abdominal:     General: Bowel sounds are normal.     Palpations: Abdomen is soft.  Musculoskeletal:     Cervical back: Normal range of motion.  Lymphadenopathy:     Cervical: No cervical adenopathy.  Skin:    General: Skin is warm and dry.     Capillary Refill: Capillary refill takes less than 2 seconds.     Comments: No digital ulcerations or signs of gangrene noted.  No Malar rash noted.  Neurological:     Mental Status: She is alert and oriented to person, place, and time.  Psychiatric:        Behavior: Behavior normal.     Musculoskeletal Exam: C-spine, thoracic spine, lumbar spine good range of motion with no discomfort.  No midline spinal tenderness or SI joint tenderness noted.  Both shoulder joints have good range of motion with no discomfort.  Some tenderness over the right AC joint noted.  Elbow joints, wrist joints, MCPs, PIPs, DIPs have good range of motion with no synovitis.  Complete fist formation bilaterally.  Hip joints have good range of motion with no groin pain.  Tenderness over bilateral trochanteric bursa.  Knee joints have good range of motion with no warmth or effusion.  Ankle joints have good range of motion with no tenderness or joint swelling.  CDAI Exam: CDAI Score: -- Patient  Global: --; Provider Global: -- Swollen: --; Tender: -- Joint Exam 12/06/2020   No joint exam has been documented for this visit   There is currently no information documented on the homunculus. Go to the Rheumatology activity and complete the homunculus joint exam.  Investigation: No additional findings.  Imaging: No results found.  Recent Labs: Lab Results  Component Value Date   WBC 2.7 (L) 07/05/2020   HGB 9.8 (L) 07/05/2020   PLT 337 07/05/2020   NA 136 07/05/2020   K 4.2 07/05/2020   CL 101 07/05/2020   CO2 27 07/05/2020   GLUCOSE 76 07/05/2020   BUN 22 07/05/2020   CREATININE 1.11 (H) 07/05/2020   BILITOT 0.2 07/05/2020   ALKPHOS 69 09/03/2016   AST 17 07/05/2020   ALT 17 07/05/2020   PROT 8.3 (H) 07/05/2020   ALBUMIN 4.5 09/03/2016   CALCIUM 10.5 (H) 07/05/2020   GFRAA 65 07/05/2020  Speciality Comments: PLQ Eye Exam: 04/11/2020  WNL @ [facility] follow up in 6 months   Procedures:  No procedures performed Allergies: Lisinopril   .   Assessment / Plan:     Visit Diagnoses: Other organ or system involvement in systemic lupus erythematosus ([place]) - +ANA, +ds DNA, + Smith, RNP and Ro: She has not had any signs or symptoms of a systemic lupus flare recently.  She has clinically been doing well taking Plaquenil 200 mg 1 tablet by mouth twice daily Monday through Friday.  She continues to tolerate Plaquenil without any side effects and has not missed any doses recently.  No Malar rash was noted on examination today.  She has not had any photosensitivity or signs of alopecia.  No oral or nasal ulcerations.  She has not had any sicca symptoms.  No cervical lymphadenopathy or recent fevers.  She has not had any shortness of breath, pleuritic chest pain, palpitations.  Her lungs were clear to auscultation on examination today.  She has not had any symptoms of Raynaud's and no digital ulcerations or signs of gangrene were noted on examination today. She has been  experiencing some increased arthralgias over the past several weeks especially in both shoulders and on the lateral aspect of both hips.  She has no synovitis.  On examination today she has good range of motion of both shoulder joints with no effusion noted.  Her symptoms have been mild and have not required oral NSAIDs or Tylenol.  She declined x-rays and a cortisone injection today.  She plans on using Voltaren gel topically as needed for pain relief.  She was advised to notify us if she develops increased joint pain or joint swelling. Lab work from 07/05/20 was reviewed today in the office: complements WNL,  dsDNA 16, and ESR WNL.  She is due to update lab work today.  Orders released.  She will remain on Plaquenil as prescribed.  She was advised to notify us if she develops signs or symptoms of a flare.  She will follow-up in the office in 5 months. - Plan: Protein / creatinine ratio, urine, CBC with Differential/Platelet, COMPLETE METABOLIC PANEL WITH GFR, Anti-DNA antibody, double-stranded, C3 and C4, Sedimentation rate, ANA, Hydroxychloroquine, Blood  High risk medication use - Plaquenil 200 mg 1 tablet by mouth twice daily Monday through Friday only. PLQ Eye Exam: 04/11/2020.  CBC and CMP were drawn on 07/05/2020.  She is due to update lab work today.  Orders for CBC and CMP were released.- Plan: CBC with Differential/Platelet, COMPLETE METABOLIC PANEL WITH GFR She has not had any recent infections.  Primary osteoarthritis of both hands: She has no tenderness or inflammation on examination today.  She was able to make a complete fist bilaterally.  Discussed the importance of joint protection and muscle strengthening.  Bilateral primary osteoarthritis of knee: She has good range of motion of both knee joints on examination today.  No warmth or effusion was noted.  She can use Voltaren gel topically as needed for pain relief.  She will continue to take turmeric as recommended.  Primary osteoarthritis  of both feet: She is not experiencing any discomfort in her feet at this time.  She has good range of motion of both ankle joints with no tenderness or joint swelling.  Trochanteric bursitis of both hips: She has tenderness palpation over bilateral trochanteric bursa on examination today.  She has good range of motion of both hip joints with no groin pain.  The importance of performing stretching exercises on a daily basis.  She was given a handout of exercises to perform.  She declined cortisone injections today.  She was advised to notify us if her symptoms persist or worsen.  Lumbar facet arthropathy: She is not experiencing any increased discomfort in her lower back at this time.  She has no midline spinal tenderness or SI joint tenderness on examination.  No symptoms of radiculopathy.  Essential hypertension: Blood pressure is 132/83 today in the office.  Orders: Orders Placed This Encounter  Procedures   Protein / creatinine ratio, urine   CBC with Differential/Platelet   COMPLETE METABOLIC PANEL WITH GFR   Anti-DNA antibody, double-stranded   C3 and C4   Sedimentation rate   ANA   Hydroxychloroquine, Blood   No orders of the defined types were placed in this encounter.     Follow-Up Instructions: Return in about 5 months (around 05/06/2021) for Systemic lupus erythematosus.   Ofilia Neas, PA-C  Note - This record has been created using Dragon software.  Chart creation errors have been sought, but may not always  have been located. Such creation errors do not reflect on  the standard of medical care.

## 2020-12-06 ENCOUNTER — Other Ambulatory Visit: Payer: Self-pay

## 2020-12-06 ENCOUNTER — Encounter: Payer: Self-pay | Admitting: Physician Assistant

## 2020-12-06 ENCOUNTER — Ambulatory Visit: Payer: BC Managed Care – PPO | Admitting: Physician Assistant

## 2020-12-06 VITALS — BP 132/83 | HR 102 | Resp 12 | Ht 65.0 in | Wt 203.6 lb

## 2020-12-06 DIAGNOSIS — M19041 Primary osteoarthritis, right hand: Secondary | ICD-10-CM

## 2020-12-06 DIAGNOSIS — M7062 Trochanteric bursitis, left hip: Secondary | ICD-10-CM

## 2020-12-06 DIAGNOSIS — M3219 Other organ or system involvement in systemic lupus erythematosus: Secondary | ICD-10-CM

## 2020-12-06 DIAGNOSIS — M170 Bilateral primary osteoarthritis of knee: Secondary | ICD-10-CM | POA: Diagnosis not present

## 2020-12-06 DIAGNOSIS — M47816 Spondylosis without myelopathy or radiculopathy, lumbar region: Secondary | ICD-10-CM

## 2020-12-06 DIAGNOSIS — M19072 Primary osteoarthritis, left ankle and foot: Secondary | ICD-10-CM

## 2020-12-06 DIAGNOSIS — M7061 Trochanteric bursitis, right hip: Secondary | ICD-10-CM

## 2020-12-06 DIAGNOSIS — I10 Essential (primary) hypertension: Secondary | ICD-10-CM

## 2020-12-06 DIAGNOSIS — Z79899 Other long term (current) drug therapy: Secondary | ICD-10-CM

## 2020-12-06 DIAGNOSIS — M19071 Primary osteoarthritis, right ankle and foot: Secondary | ICD-10-CM

## 2020-12-06 DIAGNOSIS — M19042 Primary osteoarthritis, left hand: Secondary | ICD-10-CM

## 2020-12-06 NOTE — Patient Instructions (Signed)
Hip Bursitis Rehab °Ask your health care provider which exercises are safe for you. Do exercises exactly as told by your health care provider and adjust them as directed. It is normal to feel mild stretching, pulling, tightness, or discomfort as you do these exercises. Stop right away if you feel sudden pain or your pain gets worse. Do not begin these exercises until told by your health care provider. °Stretching exercise °This exercise warms up your muscles and joints and improves the movement and flexibility of your hip. This exercise also helps to relieve pain and stiffness. °Iliotibial band stretch °An iliotibial band is a strong band of muscle tissue that runs from the outer side of your hip to the outer side of your thigh and knee. °Lie on your side with your left / right leg in the top position. °Bend your left / right knee and grab your ankle. Stretch out your bottom arm to help you balance. °Slowly bring your knee back so your thigh is behind your body. °Slowly lower your knee toward the floor until you feel a gentle stretch on the outside of your left / right thigh. If you do not feel a stretch and your knee will not fall farther, place the heel of your other foot on top of your knee and pull your knee down toward the floor with your foot. °Hold this position for __________ seconds. °Slowly return to the starting position. °Repeat __________ times. Complete this exercise __________ times a day. °Strengthening exercises °These exercises build strength and endurance in your hip and pelvis. Endurance is the ability to use your muscles for a long time, even after they get tired. °Bridge °This exercise strengthens the muscles that move your thigh backward (hip extensors). °Lie on your back on a firm surface with your knees bent and your feet flat on the floor. °Tighten your buttocks muscles and lift your buttocks off the floor until your trunk is level with your thighs. °Do not arch your back. °You should feel  the muscles working in your buttocks and the back of your thighs. If you do not feel these muscles, slide your feet 1-2 inches (2.5-5 cm) farther away from your buttocks. °If this exercise is too easy, try doing it with your arms crossed over your chest. °Hold this position for __________ seconds. °Slowly lower your hips to the starting position. °Let your muscles relax completely after each repetition. °Repeat __________ times. Complete this exercise __________ times a day. °Squats °This exercise strengthens the muscles in front of your thigh and knee (quadriceps). °Stand in front of a table, with your feet and knees pointing straight ahead. You may rest your hands on the table for balance but not for support. °Slowly bend your knees and lower your hips like you are going to sit in a chair. °Keep your weight over your heels, not over your toes. °Keep your lower legs upright so they are parallel with the table legs. °Do not let your hips go lower than your knees. °Do not bend lower than told by your health care provider. °If your hip pain increases, do not bend as low. °Hold the squat position for __________ seconds. °Slowly push with your legs to return to standing. Do not use your hands to pull yourself to standing. °Repeat __________ times. Complete this exercise __________ times a day. °Hip hike °Stand sideways on a bottom step. Stand on your left / right leg with your other foot unsupported next to the step. You can hold   on to the railing or wall for balance if needed. °Keep your knees straight and your torso square. Then lift your left / right hip up toward the ceiling. °Hold this position for __________ seconds. °Slowly let your left / right hip lower toward the floor, past the starting position. Your foot should get closer to the floor. Do not lean or bend your knees. °Repeat __________ times. Complete this exercise __________ times a day. °Single leg stand °Without shoes, stand near a railing or in a  doorway. You may hold on to the railing or door frame as needed for balance. °Squeeze your left / right buttock muscles, then lift up your other foot. °Do not let your left / right hip push out to the side. °It is helpful to stand in front of a mirror for this exercise so you can watch your hip. °Hold this position for __________ seconds. °Repeat __________ times. Complete this exercise __________ times a day. °This information is not intended to replace advice given to you by your health care provider. Make sure you discuss any questions you have with your health care provider. °Document Revised: 04/28/2018 Document Reviewed: 04/28/2018 °[employer] Patient Education © 2022 [employer] °Shoulder Exercises °Ask your health care provider which exercises are safe for you. Do exercises exactly as told by your health care provider and adjust them as directed. It is normal to feel mild stretching, pulling, tightness, or discomfort as you do these exercises. Stop right away if you feel sudden pain or your pain gets worse. Do not begin these exercises until told by your health care provider. °Stretching exercises °External rotation and abduction °This exercise is sometimes called corner stretch. This exercise rotates your arm outward (external rotation) and moves your arm out from your body (abduction). °Stand in a doorway with one of your feet slightly in front of the other. This is called a staggered stance. If you cannot reach your forearms to the door frame, stand facing a corner of a room. °Choose one of the following positions as told by your health care provider: °Place your hands and forearms on the door frame above your head. °Place your hands and forearms on the door frame at the height of your head. °Place your hands on the door frame at the height of your elbows. °Slowly move your weight onto your front foot until you feel a stretch across your chest and in the front of your shoulders. Keep your head and chest  upright and keep your abdominal muscles tight. °Hold for __________ seconds. °To release the stretch, shift your weight to your back foot. °Repeat __________ times. Complete this exercise __________ times a day. °Extension, standing °Stand and hold a broomstick, a cane, or a similar object behind your back. °Your hands should be a little wider than shoulder width apart. °Your palms should face away from your back. °Keeping your elbows straight and your shoulder muscles relaxed, move the stick away from your body until you feel a stretch in your shoulders (extension). °Avoid shrugging your shoulders while you move the stick. Keep your shoulder blades tucked down toward the middle of your back. °Hold for __________ seconds. °Slowly return to the starting position. °Repeat __________ times. Complete this exercise __________ times a day. °Range-of-motion exercises °Pendulum ° °Stand near a wall or a surface that you can hold onto for balance. °Bend at the waist and let your left / right arm hang straight down. Use your other arm to support you. Keep your back   straight and do not lock your knees. °Relax your left / right arm and shoulder muscles, and move your hips and your trunk so your left / right arm swings freely. Your arm should swing because of the motion of your body, not because you are using your arm or shoulder muscles. °Keep moving your hips and trunk so your arm swings in the following directions, as told by your health care provider: °Side to side. °Forward and backward. °In clockwise and counterclockwise circles. °Continue each motion for __________ seconds, or for as long as told by your health care provider. °Slowly return to the starting position. °Repeat __________ times. Complete this exercise __________ times a day. °Shoulder flexion, standing ° °Stand and hold a broomstick, a cane, or a similar object. Place your hands a little more than shoulder width apart on the object. Your left / right hand  should be palm up, and your other hand should be palm down. °Keep your elbow straight and your shoulder muscles relaxed. Push the stick up with your healthy arm to raise your left / right arm in front of your body, and then over your head until you feel a stretch in your shoulder (flexion). °Avoid shrugging your shoulder while you raise your arm. Keep your shoulder blade tucked down toward the middle of your back. °Hold for __________ seconds. °Slowly return to the starting position. °Repeat __________ times. Complete this exercise __________ times a day. °Shoulder abduction, standing °Stand and hold a broomstick, a cane, or a similar object. Place your hands a little more than shoulder width apart on the object. Your left / right hand should be palm up, and your other hand should be palm down. °Keep your elbow straight and your shoulder muscles relaxed. Push the object across your body toward your left / right side. Raise your left / right arm to the side of your body (abduction) until you feel a stretch in your shoulder. °Do not raise your arm above shoulder height unless your health care provider tells you to do that. °If directed, raise your arm over your head. °Avoid shrugging your shoulder while you raise your arm. Keep your shoulder blade tucked down toward the middle of your back. °Hold for __________ seconds. °Slowly return to the starting position. °Repeat __________ times. Complete this exercise __________ times a day. °Internal rotation ° °Place your left / right hand behind your back, palm up. °Use your other hand to dangle an exercise band, a towel, or a similar object over your shoulder. Grasp the band with your left / right hand so you are holding on to both ends. °Gently pull up on the band until you feel a stretch in the front of your left / right shoulder. The movement of your arm toward the center of your body is called internal rotation. °Avoid shrugging your shoulder while you raise your arm.  Keep your shoulder blade tucked down toward the middle of your back. °Hold for __________ seconds. °Release the stretch by letting go of the band and lowering your hands. °Repeat __________ times. Complete this exercise __________ times a day. °Strengthening exercises °External rotation ° °Sit in a stable chair without armrests. °Secure an exercise band to a stable object at elbow height on your left / right side. °Place a soft object, such as a folded towel or a small pillow, between your left / right upper arm and your body to move your elbow about 4 inches (10 cm) away from your side. °Hold the   end of the exercise band so it is tight and there is no slack. °Keeping your elbow pressed against the soft object, slowly move your forearm out, away from your abdomen (external rotation). Keep your body steady so only your forearm moves. °Hold for __________ seconds. °Slowly return to the starting position. °Repeat __________ times. Complete this exercise __________ times a day. °Shoulder abduction ° °Sit in a stable chair without armrests, or stand up. °Hold a __________ weight in your left / right hand, or hold an exercise band with both hands. °Start with your arms straight down and your left / right palm facing in, toward your body. °Slowly lift your left / right hand out to your side (abduction). Do not lift your hand above shoulder height unless your health care provider tells you that this is safe. °Keep your arms straight. °Avoid shrugging your shoulder while you do this movement. Keep your shoulder blade tucked down toward the middle of your back. °Hold for __________ seconds. °Slowly lower your arm, and return to the starting position. °Repeat __________ times. Complete this exercise __________ times a day. °Shoulder extension °Sit in a stable chair without armrests, or stand up. °Secure an exercise band to a stable object in front of you so it is at shoulder height. °Hold one end of the exercise band in each  hand. Your palms should face each other. °Straighten your elbows and lift your hands up to shoulder height. °Step back, away from the secured end of the exercise band, until the band is tight and there is no slack. °Squeeze your shoulder blades together as you pull your hands down to the sides of your thighs (extension). Stop when your hands are straight down by your sides. Do not let your hands go behind your body. °Hold for __________ seconds. °Slowly return to the starting position. °Repeat __________ times. Complete this exercise __________ times a day. °Shoulder row °Sit in a stable chair without armrests, or stand up. °Secure an exercise band to a stable object in front of you so it is at waist height. °Hold one end of the exercise band in each hand. Position your palms so that your thumbs are facing the ceiling (neutral position). °Bend each of your elbows to a 90-degree angle (right angle) and keep your upper arms at your sides. °Step back until the band is tight and there is no slack. °Slowly pull your elbows back behind you. °Hold for __________ seconds. °Slowly return to the starting position. °Repeat __________ times. Complete this exercise __________ times a day. °Shoulder press-ups ° °Sit in a stable chair that has armrests. Sit upright, with your feet flat on the floor. °Put your hands on the armrests so your elbows are bent and your fingers are pointing forward. Your hands should be about even with the sides of your body. °Push down on the armrests and use your arms to lift yourself off the chair. Straighten your elbows and lift yourself up as much as you comfortably can. °Move your shoulder blades down, and avoid letting your shoulders move up toward your ears. °Keep your feet on the ground. As you get stronger, your feet should support less of your body weight as you lift yourself up. °Hold for __________ seconds. °Slowly lower yourself back into the chair. °Repeat __________ times. Complete this  exercise __________ times a day. °Wall push-ups ° °Stand so you are facing a stable wall. Your feet should be about one arm-length away from the wall. °Lean forward and   place your palms on the wall at shoulder height. °Keep your feet flat on the floor as you bend your elbows and lean forward toward the wall. °Hold for __________ seconds. °Straighten your elbows to push yourself back to the starting position. °Repeat __________ times. Complete this exercise __________ times a day. °This information is not intended to replace advice given to you by your health care provider. Make sure you discuss any questions you have with your health care provider. °Document Revised: 04/25/2018 Document Reviewed: 01/31/2018 °[employer] Patient Education © 2022 [employer] ° °

## 2020-12-07 NOTE — Progress Notes (Signed)
No proteinuria.   WBC count remains low but is trending up.  Anemia has improved.   Creatinine is elevated but stable. GFR is slightly low-54.  Please advise the patient to avoid the use of NSAIDs.  dsDNA is positive but stable-17.  Complements and ESR WNL.    ANA and PLQ level are pending.

## 2020-12-12 NOTE — Progress Notes (Signed)
ANA remains positive but is a slightly lower titer.

## 2020-12-13 DIAGNOSIS — Z6834 Body mass index (BMI) 34.0-34.9, adult: Secondary | ICD-10-CM | POA: Diagnosis not present

## 2020-12-13 DIAGNOSIS — U071 COVID-19: Secondary | ICD-10-CM | POA: Diagnosis not present

## 2020-12-13 DIAGNOSIS — Z20822 Contact with and (suspected) exposure to covid-19: Secondary | ICD-10-CM | POA: Diagnosis not present

## 2020-12-13 DIAGNOSIS — E669 Obesity, unspecified: Secondary | ICD-10-CM | POA: Diagnosis not present

## 2020-12-13 DIAGNOSIS — I10 Essential (primary) hypertension: Secondary | ICD-10-CM | POA: Diagnosis not present

## 2020-12-14 LAB — CBC WITH DIFFERENTIAL/PLATELET
Absolute Monocytes: 249 cells/uL (ref 200–950)
Basophils Absolute: 20 cells/uL (ref 0–200)
Basophils Relative: 0.7 %
Eosinophils Absolute: 81 cells/uL (ref 15–500)
Eosinophils Relative: 2.9 %
HCT: 33.7 % — ABNORMAL LOW (ref 35.0–45.0)
Hemoglobin: 10.7 g/dL — ABNORMAL LOW (ref 11.7–15.5)
Lymphs Abs: 770 cells/uL — ABNORMAL LOW (ref 850–3900)
MCH: 23.8 pg — ABNORMAL LOW (ref 27.0–33.0)
MCHC: 31.8 g/dL — ABNORMAL LOW (ref 32.0–36.0)
MCV: 75.1 fL — ABNORMAL LOW (ref 80.0–100.0)
MPV: 10.7 fL (ref 7.5–12.5)
Monocytes Relative: 8.9 %
Neutro Abs: 1680 cells/uL (ref 1500–7800)
Neutrophils Relative %: 60 %
Platelets: 251 10*3/uL (ref 140–400)
RBC: 4.49 10*6/uL (ref 3.80–5.10)
RDW: 15.4 % — ABNORMAL HIGH (ref 11.0–15.0)
Total Lymphocyte: 27.5 %
WBC: 2.8 10*3/uL — ABNORMAL LOW (ref 3.8–10.8)

## 2020-12-14 LAB — COMPLETE METABOLIC PANEL WITH GFR
AG Ratio: 1.2 (calc) (ref 1.0–2.5)
ALT: 19 U/L (ref 6–29)
AST: 16 U/L (ref 10–35)
Albumin: 4.6 g/dL (ref 3.6–5.1)
Alkaline phosphatase (APISO): 73 U/L (ref 37–153)
BUN/Creatinine Ratio: 18 (calc) (ref 6–22)
BUN: 22 mg/dL (ref 7–25)
CO2: 29 mmol/L (ref 20–32)
Calcium: 10.2 mg/dL (ref 8.6–10.4)
Chloride: 103 mmol/L (ref 98–110)
Creat: 1.19 mg/dL — ABNORMAL HIGH (ref 0.50–1.03)
Globulin: 3.7 g/dL (calc) (ref 1.9–3.7)
Glucose, Bld: 54 mg/dL — ABNORMAL LOW (ref 65–99)
Potassium: 4.2 mmol/L (ref 3.5–5.3)
Sodium: 140 mmol/L (ref 135–146)
Total Bilirubin: 0.2 mg/dL (ref 0.2–1.2)
Total Protein: 8.3 g/dL — ABNORMAL HIGH (ref 6.1–8.1)
eGFR: 54 mL/min/{1.73_m2} — ABNORMAL LOW (ref >=60)

## 2020-12-14 LAB — ANTI-NUCLEAR AB-TITER (ANA TITER): ANA Titer 1: 1:320 {titer} — ABNORMAL HIGH

## 2020-12-14 LAB — PROTEIN / CREATININE RATIO, URINE
Creatinine, Urine: 65 mg/dL (ref 20–275)
Protein/Creat Ratio: 62 mg/g creat (ref 24–184)
Protein/Creatinine Ratio: 0.062 mg/mg creat (ref 0.024–0.184)
Total Protein, Urine: 4 mg/dL — ABNORMAL LOW (ref 5–24)

## 2020-12-14 LAB — HYDROXYCHLOROQUINE,BLOOD: HYDROXYCHLOROQUINE, (B): 930 ng/mL — ABNORMAL HIGH

## 2020-12-14 LAB — C3 AND C4
C3 Complement: 118 mg/dL (ref 83–193)
C4 Complement: 43 mg/dL (ref 15–57)

## 2020-12-14 LAB — ANTI-DNA ANTIBODY, DOUBLE-STRANDED: ds DNA Ab: 17 IU/mL — ABNORMAL HIGH

## 2020-12-14 LAB — ANA: Anti Nuclear Antibody (ANA): POSITIVE — AB

## 2020-12-14 LAB — SEDIMENTATION RATE: Sed Rate: 14 mm/h (ref 0–30)

## 2020-12-15 ENCOUNTER — Telehealth: Payer: Self-pay | Admitting: Rheumatology

## 2020-12-15 NOTE — Telephone Encounter (Signed)
Spoke with patient and she states it is time for the work from home paperwork to be renewed. Patient verified fax number and will fax paperwork to office.

## 2020-12-15 NOTE — Telephone Encounter (Signed)
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 Patient has a couple of questions regarding form. Please call to advise.

## 2020-12-15 NOTE — Progress Notes (Signed)
Plaquenil is within the therapeutic range. Continue on current dose of plaquenil.

## 2020-12-15 NOTE — Telephone Encounter (Signed)
Ok to renew necessary paperwork for her to continue to work from home.

## 2020-12-16 NOTE — Telephone Encounter (Signed)
Patient advised we will be able to renew her paperwork. Patient states she has not faxed the paperwork as of yet. Patient will fax it this afternoon. Patient advised we will work on it and fax it as quickly as we can. Patient expressed understanding.

## 2021-01-17 ENCOUNTER — Other Ambulatory Visit: Payer: Self-pay | Admitting: Physician Assistant

## 2021-01-17 DIAGNOSIS — M3219 Other organ or system involvement in systemic lupus erythematosus: Secondary | ICD-10-CM

## 2021-01-18 NOTE — Telephone Encounter (Signed)
Next Visit: 05/02/2021  Last Visit: 12/06/2020  Labs: 12/06/2020 WBC count remains low but is trending up.  Anemia has improved.   Creatinine is elevated but stable. GFR is slightly low-54.  Eye exam: 04/11/2020  WNL    Current Dose per office note 12/06/2020: Plaquenil 200 mg 1 tablet by mouth twice daily Monday through Friday only  FT:DDUKG organ or system involvement in systemic lupus erythematosus  Last Fill: 11/08/2020  Okay to refill Plaquenil?

## 2021-01-24 DIAGNOSIS — R197 Diarrhea, unspecified: Secondary | ICD-10-CM | POA: Diagnosis not present

## 2021-01-24 DIAGNOSIS — R1084 Generalized abdominal pain: Secondary | ICD-10-CM | POA: Diagnosis not present

## 2021-01-25 DIAGNOSIS — R197 Diarrhea, unspecified: Secondary | ICD-10-CM | POA: Diagnosis not present

## 2021-01-25 DIAGNOSIS — R1084 Generalized abdominal pain: Secondary | ICD-10-CM | POA: Diagnosis not present

## 2021-02-08 DIAGNOSIS — N1831 Chronic kidney disease, stage 3a: Secondary | ICD-10-CM | POA: Diagnosis not present

## 2021-02-08 DIAGNOSIS — Z0000 Encounter for general adult medical examination without abnormal findings: Secondary | ICD-10-CM | POA: Diagnosis not present

## 2021-02-08 DIAGNOSIS — Z23 Encounter for immunization: Secondary | ICD-10-CM | POA: Diagnosis not present

## 2021-02-08 DIAGNOSIS — R809 Proteinuria, unspecified: Secondary | ICD-10-CM | POA: Diagnosis not present

## 2021-02-08 DIAGNOSIS — E1129 Type 2 diabetes mellitus with other diabetic kidney complication: Secondary | ICD-10-CM | POA: Diagnosis not present

## 2021-02-08 DIAGNOSIS — I129 Hypertensive chronic kidney disease with stage 1 through stage 4 chronic kidney disease, or unspecified chronic kidney disease: Secondary | ICD-10-CM | POA: Diagnosis not present

## 2021-02-16 DIAGNOSIS — Z0001 Encounter for general adult medical examination with abnormal findings: Secondary | ICD-10-CM | POA: Diagnosis not present

## 2021-02-16 DIAGNOSIS — Z13220 Encounter for screening for lipoid disorders: Secondary | ICD-10-CM | POA: Diagnosis not present

## 2021-02-16 DIAGNOSIS — Z1321 Encounter for screening for nutritional disorder: Secondary | ICD-10-CM | POA: Diagnosis not present

## 2021-02-16 DIAGNOSIS — Z79899 Other long term (current) drug therapy: Secondary | ICD-10-CM | POA: Diagnosis not present

## 2021-02-16 DIAGNOSIS — E785 Hyperlipidemia, unspecified: Secondary | ICD-10-CM | POA: Diagnosis not present

## 2021-02-16 DIAGNOSIS — N1831 Chronic kidney disease, stage 3a: Secondary | ICD-10-CM | POA: Diagnosis not present

## 2021-02-16 DIAGNOSIS — Z7984 Long term (current) use of oral hypoglycemic drugs: Secondary | ICD-10-CM | POA: Diagnosis not present

## 2021-02-16 DIAGNOSIS — I129 Hypertensive chronic kidney disease with stage 1 through stage 4 chronic kidney disease, or unspecified chronic kidney disease: Secondary | ICD-10-CM | POA: Diagnosis not present

## 2021-02-16 DIAGNOSIS — Z1329 Encounter for screening for other suspected endocrine disorder: Secondary | ICD-10-CM | POA: Diagnosis not present

## 2021-02-16 DIAGNOSIS — M329 Systemic lupus erythematosus, unspecified: Secondary | ICD-10-CM | POA: Diagnosis not present

## 2021-02-16 DIAGNOSIS — R809 Proteinuria, unspecified: Secondary | ICD-10-CM | POA: Diagnosis not present

## 2021-02-16 DIAGNOSIS — E1129 Type 2 diabetes mellitus with other diabetic kidney complication: Secondary | ICD-10-CM | POA: Diagnosis not present

## 2021-02-16 DIAGNOSIS — Z0000 Encounter for general adult medical examination without abnormal findings: Secondary | ICD-10-CM | POA: Diagnosis not present

## 2021-02-16 DIAGNOSIS — Z131 Encounter for screening for diabetes mellitus: Secondary | ICD-10-CM | POA: Diagnosis not present

## 2021-02-16 DIAGNOSIS — E1122 Type 2 diabetes mellitus with diabetic chronic kidney disease: Secondary | ICD-10-CM | POA: Diagnosis not present

## 2021-02-16 DIAGNOSIS — E669 Obesity, unspecified: Secondary | ICD-10-CM | POA: Diagnosis not present

## 2021-03-06 DIAGNOSIS — N1831 Chronic kidney disease, stage 3a: Secondary | ICD-10-CM | POA: Diagnosis not present

## 2021-03-06 DIAGNOSIS — N183 Chronic kidney disease, stage 3 unspecified: Secondary | ICD-10-CM | POA: Diagnosis not present

## 2021-03-06 DIAGNOSIS — E1129 Type 2 diabetes mellitus with other diabetic kidney complication: Secondary | ICD-10-CM | POA: Diagnosis not present

## 2021-03-06 DIAGNOSIS — Z9189 Other specified personal risk factors, not elsewhere classified: Secondary | ICD-10-CM | POA: Diagnosis not present

## 2021-03-06 DIAGNOSIS — R809 Proteinuria, unspecified: Secondary | ICD-10-CM | POA: Diagnosis not present

## 2021-03-13 DIAGNOSIS — N1831 Chronic kidney disease, stage 3a: Secondary | ICD-10-CM | POA: Diagnosis not present

## 2021-03-13 DIAGNOSIS — I129 Hypertensive chronic kidney disease with stage 1 through stage 4 chronic kidney disease, or unspecified chronic kidney disease: Secondary | ICD-10-CM | POA: Diagnosis not present

## 2021-03-13 DIAGNOSIS — N179 Acute kidney failure, unspecified: Secondary | ICD-10-CM | POA: Diagnosis not present

## 2021-03-13 DIAGNOSIS — M329 Systemic lupus erythematosus, unspecified: Secondary | ICD-10-CM | POA: Diagnosis not present

## 2021-03-14 ENCOUNTER — Other Ambulatory Visit: Payer: Self-pay | Admitting: Physician Assistant

## 2021-03-14 DIAGNOSIS — M3219 Other organ or system involvement in systemic lupus erythematosus: Secondary | ICD-10-CM

## 2021-03-20 DIAGNOSIS — Z01419 Encounter for gynecological examination (general) (routine) without abnormal findings: Secondary | ICD-10-CM | POA: Diagnosis not present

## 2021-03-29 DIAGNOSIS — R928 Other abnormal and inconclusive findings on diagnostic imaging of breast: Secondary | ICD-10-CM | POA: Diagnosis not present

## 2021-03-29 DIAGNOSIS — Z9189 Other specified personal risk factors, not elsewhere classified: Secondary | ICD-10-CM | POA: Diagnosis not present

## 2021-03-29 DIAGNOSIS — N6489 Other specified disorders of breast: Secondary | ICD-10-CM | POA: Diagnosis not present

## 2021-04-19 NOTE — Progress Notes (Signed)
? ?Office Visit Note ? ?Patient: Stephanie Myers             ?Date of Birth: 1965/11/21           ?MRN: [id number]             ?PCP: Loyal Jacobson, MD ?Referring: Loyal Jacobson, MD ?Visit Date: 05/02/2021 ?Occupation: @GUAROCC @ ? ?Subjective:  ?Medication management ? ?History of Present Illness: Stephanie Myers is a 56 y.o. female with a history of systemic lupus and osteoarthritis.  She denies any history of oral ulcers, nasal ulcers, malar rash, photosensitivity, Raynaud's phenomenon, lymphadenopathy or inflammatory arthritis.  She continues to have some tenderness in bilateral trochanteric area.  She has nocturnal pain when she sleeps on her side.  She has been doing some stretching exercises.  She has occasional stiffness in her hands.  She has off-and-on discomfort in her lower back. ? ?Activities of Daily Living:  ?Patient reports morning stiffness for 0 minute.   ?Patient Reports nocturnal pain.  ?Difficulty dressing/grooming: Denies ?Difficulty climbing stairs: Denies ?Difficulty getting out of chair: Denies ?Difficulty using hands for taps, buttons, cutlery, and/or writing: Denies ? ?Review of Systems  ?Constitutional:  Negative for fatigue and night sweats.  ?HENT:  Negative for mouth sores, trouble swallowing, trouble swallowing, mouth dryness and nose dryness.   ?Eyes:  Negative for pain, redness, visual disturbance and dryness.  ?Respiratory:  Negative for cough, shortness of breath and difficulty breathing.   ?Cardiovascular:  Negative for chest pain, palpitations, hypertension, irregular heartbeat and swelling in legs/feet.  ?Gastrointestinal:  Positive for constipation. Negative for blood in stool and diarrhea.  ?Endocrine: Negative for increased urination.  ?Genitourinary:  Negative for vaginal dryness.  ?Musculoskeletal:  Negative for joint pain, joint pain, joint swelling, myalgias, muscle weakness, morning stiffness, muscle tenderness and myalgias.  ?Skin:  Negative for color change, rash, hair  loss, skin tightness, ulcers and sensitivity to sunlight.  ?Allergic/Immunologic: Negative for susceptible to infections.  ?Neurological:  Negative for dizziness, memory loss, night sweats and weakness.  ?Hematological:  Negative for swollen glands.  ?Psychiatric/Behavioral:  Negative for depressed mood and sleep disturbance. The patient is not nervous/anxious.   ? ?PMFS History:  ?Patient Active Problem List  ? Diagnosis Date Noted  ? Essential hypertension 04/17/2016  ? Other organ or system involvement in systemic lupus erythematosus (HCC) 12/20/2015  ? High risk medication use 12/20/2015  ? Osteoarthritis, hand 12/20/2015  ? Osteoarthritis of foot 12/20/2015  ? Bilateral primary osteoarthritis of knee 12/20/2015  ?  ?Past Medical History:  ?Diagnosis Date  ? Anemia   ? Diabetes mellitus without complication (HCC)   ? Hyperlipidemia   ? Hypertension   ? Lupus (HCC)   ? Shingles   ? Systemic lupus erythematosus (HCC)   ?  ?Family History  ?Problem Relation Age of Onset  ? Healthy Daughter   ? Healthy Son   ? ?Past Surgical History:  ?Procedure Laterality Date  ? ABDOMINAL HYSTERECTOMY    ? CESAREAN SECTION    ? ?Social History  ? ?Social History Narrative  ? Not on file  ? ?Immunization History  ?Administered Date(s) Administered  ? Hepatitis B 12/05/2016, 06/04/2017, 12/26/2017  ? Influenza,inj,Quad PF,6+ Mos 10/07/2015, 12/05/2016, 12/26/2017  ? PFIZER Comirnaty(Gray Top)Covid-19 Tri-Sucrose Vaccine 04/05/2019, 04/26/2019  ? Pneumococcal Polysaccharide-23 03/05/2011  ? Tdap 03/05/2011  ?  ? ?Objective: ?Vital Signs: BP 112/77 (BP Location: Left Arm, Patient Position: Sitting, Cuff Size: Normal)   Pulse 98   Ht 5\' 4"  (  1.626 m)   Wt 192 lb 12.8 oz (87.5 kg)   BMI 33.09 kg/m?   ? ?Physical Exam ?Vitals and nursing note reviewed.  ?Constitutional:   ?   Appearance: She is well-developed.  ?HENT:  ?   Head: Normocephalic and atraumatic.  ?Eyes:  ?   Conjunctiva/sclera: Conjunctivae normal.  ?Cardiovascular:  ?    Rate and Rhythm: Normal rate and regular rhythm.  ?   Heart sounds: Normal heart sounds.  ?Pulmonary:  ?   Effort: Pulmonary effort is normal.  ?   Breath sounds: Normal breath sounds.  ?Abdominal:  ?   General: Bowel sounds are normal.  ?   Palpations: Abdomen is soft.  ?Musculoskeletal:  ?   Cervical back: Normal range of motion.  ?Lymphadenopathy:  ?   Cervical: No cervical adenopathy.  ?Skin: ?   General: Skin is warm and dry.  ?   Capillary Refill: Capillary refill takes less than 2 seconds.  ?Neurological:  ?   Mental Status: She is alert and oriented to person, place, and time.  ?Psychiatric:     ?   Behavior: Behavior normal.  ?  ? ?Musculoskeletal Exam: C-spine was in good range of motion.  She has some discomfort range of motion of her lumbar spine.  Shoulder joints, elbow joints, wrist joints, MCPs PIPs and DIPs and good range of motion with no synovitis.  She had tenderness over bilateral trochanteric bursa.  Hip joints, knee joints,  and ankles were in good range of motion with no synovitis. ? ?CDAI Exam: ?CDAI Score: -- ?Patient Global: --; Provider Global: -- ?Swollen: --; Tender: -- ?Joint Exam 05/02/2021  ? ?No joint exam has been documented for this visit  ? ?There is currently no information documented on the homunculus. Go to the Rheumatology activity and complete the homunculus joint exam. ? ?Investigation: ?No additional findings. ? ?Imaging: ?No results found. ? ?Recent Labs: ?Lab Results  ?Component Value Date  ? WBC 2.8 (L) 12/06/2020  ? HGB 10.7 (L) 12/06/2020  ? PLT 251 12/06/2020  ? NA 140 12/06/2020  ? K 4.2 12/06/2020  ? CL 103 12/06/2020  ? CO2 29 12/06/2020  ? GLUCOSE 54 (L) 12/06/2020  ? BUN 22 12/06/2020  ? CREATININE 1.19 (H) 12/06/2020  ? BILITOT 0.2 12/06/2020  ? ALKPHOS 69 09/03/2016  ? AST 16 12/06/2020  ? ALT 19 12/06/2020  ? PROT 8.3 (H) 12/06/2020  ? ALBUMIN 4.5 09/03/2016  ? CALCIUM 10.2 12/06/2020  ? GFRAA 65 07/05/2020  ? ? ?Speciality Comments: PLQ Eye Exam: 05/01/2021  WNL @ [facility] follow up in 6 months ? ?Procedures:  ?No procedures performed ?Allergies: Lisinopril  ? ?Assessment / Plan:     ?Visit Diagnoses: Other organ or system involvement in systemic lupus erythematosus (HCC) - +ANA, +ds DNA, + Smith, RNP and Ro:  -She denies any history of oral ulcers, nasal ulcers, malar rash, photosensitivity, Raynaud's phenomenon, lymphadenopathy or inflammatory arthritis.  She continues to have some stiffness in her joints.  Plan: Protein / creatinine ratio, urine, Anti-DNA antibody, double-stranded, C3 and C4, Sedimentation rate.  We will contact her once the lab results are available. ? ?High risk medication use - Plaquenil 200 mg 1 tablet by mouth twice daily Monday through Friday only. PLQ Eye Exam: May 01, 2021- Plan: CBC with Differential/Platelet, COMPLETE METABOLIC PANEL WITH GFR today and every 5 months. ? ?Primary osteoarthritis of both hands-joint protection muscle strengthening was discussed. ? ?Bilateral primary osteoarthritis of knee-she is off-and-on  discomfort in her knee joints.  No warmth swelling or effusion was noted. ? ?Primary osteoarthritis of both feet-she denies any discomfort today. ? ?Trochanteric bursitis of both hips-she continues to have discomfort in bilateral trochanteric bursa.  A handout on IT band stretches was given.  Exercises were also demonstrated in the office. ? ?Lumbar facet arthropathy-lower back pain is intermittent.  Core strengthening exercises were demonstrated in the office.  A handout on back exercises was given. ? ?Essential hypertension-blood pressure was normal today. ? ?Orders: ?Orders Placed This Encounter  ?Procedures  ? Protein / creatinine ratio, urine  ? CBC with Differential/Platelet  ? COMPLETE METABOLIC PANEL WITH GFR  ? Anti-DNA antibody, double-stranded  ? C3 and C4  ? Sedimentation rate  ? ?No orders of the defined types were placed in this encounter. ? ? ? ?Follow-Up Instructions: Return in about 5 months (around  10/02/2021) for Systemic lupus, Osteoarthritis. ? ? ?Pollyann Savoy, MD ? ?Note - This record has been created using [employer].  ?Chart creation errors have been sought, but may not always  ?have been

## 2021-04-25 ENCOUNTER — Other Ambulatory Visit: Payer: Self-pay | Admitting: Physician Assistant

## 2021-04-25 DIAGNOSIS — M3219 Other organ or system involvement in systemic lupus erythematosus: Secondary | ICD-10-CM

## 2021-04-25 NOTE — Telephone Encounter (Signed)
Next Visit: 05/02/2021 ? ?Last Visit: 12/06/2020 ? ?Labs: 02/16/2021, Creatinine 1.58, GFR 39, WBC 2.3, hemoglobin 10.0, hematocrit 32.0, MCV 72.8, MCH 22.8, MCHC 31.3, Neutrophil Absolute 1.2, Lymphocyte Absolute 0.8 ? ?Eye exam: 04/11/2020 [facility] Eye exam appt 05/01/2021 ? ?Current Dose per office note 12/06/2020: Plaquenil 200 mg 1 tablet by mouth twice daily Monday through Friday only ? ?BJ:SEGBT organ or system involvement in systemic lupus erythematosus  ? ?Last Fill: 01/18/2021 ? ?Okay to refill Plaquenil? ? ?

## 2021-05-01 DIAGNOSIS — Z79899 Other long term (current) drug therapy: Secondary | ICD-10-CM | POA: Diagnosis not present

## 2021-05-01 DIAGNOSIS — Z7984 Long term (current) use of oral hypoglycemic drugs: Secondary | ICD-10-CM | POA: Diagnosis not present

## 2021-05-01 DIAGNOSIS — M328 Other forms of systemic lupus erythematosus: Secondary | ICD-10-CM | POA: Diagnosis not present

## 2021-05-01 DIAGNOSIS — E119 Type 2 diabetes mellitus without complications: Secondary | ICD-10-CM | POA: Diagnosis not present

## 2021-05-02 ENCOUNTER — Ambulatory Visit: Payer: BC Managed Care – PPO | Admitting: Rheumatology

## 2021-05-02 ENCOUNTER — Encounter: Payer: Self-pay | Admitting: Rheumatology

## 2021-05-02 VITALS — BP 112/77 | HR 98 | Ht 64.0 in | Wt 192.8 lb

## 2021-05-02 DIAGNOSIS — M3219 Other organ or system involvement in systemic lupus erythematosus: Secondary | ICD-10-CM | POA: Diagnosis not present

## 2021-05-02 DIAGNOSIS — M19041 Primary osteoarthritis, right hand: Secondary | ICD-10-CM

## 2021-05-02 DIAGNOSIS — M19071 Primary osteoarthritis, right ankle and foot: Secondary | ICD-10-CM

## 2021-05-02 DIAGNOSIS — M170 Bilateral primary osteoarthritis of knee: Secondary | ICD-10-CM | POA: Diagnosis not present

## 2021-05-02 DIAGNOSIS — M47816 Spondylosis without myelopathy or radiculopathy, lumbar region: Secondary | ICD-10-CM

## 2021-05-02 DIAGNOSIS — M7062 Trochanteric bursitis, left hip: Secondary | ICD-10-CM

## 2021-05-02 DIAGNOSIS — M19042 Primary osteoarthritis, left hand: Secondary | ICD-10-CM

## 2021-05-02 DIAGNOSIS — M7061 Trochanteric bursitis, right hip: Secondary | ICD-10-CM

## 2021-05-02 DIAGNOSIS — I10 Essential (primary) hypertension: Secondary | ICD-10-CM

## 2021-05-02 DIAGNOSIS — Z79899 Other long term (current) drug therapy: Secondary | ICD-10-CM | POA: Diagnosis not present

## 2021-05-02 DIAGNOSIS — M19072 Primary osteoarthritis, left ankle and foot: Secondary | ICD-10-CM

## 2021-05-02 NOTE — Patient Instructions (Addendum)
Vaccines ?You are taking a medication(s) that can suppress your immune system.  The following immunizations are recommended: ?Flu annually ?Covid-19  ?Td/Tdap (tetanus, diphtheria, pertussis) every 10 years ?Pneumonia (Prevnar 15 then Pneumovax 23 at least 1 year apart.  Alternatively, can take Prevnar 20 without needing additional dose) ?Shingrix: 2 doses from 4 weeks to 6 months apart ? ?Please check with your PCP to make sure you are up to date.  ? ?.Back Exercises ?The following exercises strengthen the muscles that help to support the trunk (torso) and back. They also help to keep the lower back flexible. Doing these exercises can help to prevent or lessen existing low back pain. ?If you have back pain or discomfort, try doing these exercises 2-3 times each day or as told by your health care provider. ?As your pain improves, do them once each day, but increase the number of times that you repeat the steps for each exercise (do more repetitions). ?To prevent the recurrence of back pain, continue to do these exercises once each day or as told by your health care provider. ?Do exercises exactly as told by your health care provider and adjust them as directed. It is normal to feel mild stretching, pulling, tightness, or discomfort as you do these exercises, but you should stop right away if you feel sudden pain or your pain gets worse. ?Exercises ?Single knee to chest ?Repeat these steps 3-5 times for each leg: ?Lie on your back on a firm bed or the floor with your legs extended. ?Bring one knee to your chest. Your other leg should stay extended and in contact with the floor. ?Hold your knee in place by grabbing your knee or thigh with both hands and hold. ?Pull on your knee until you feel a gentle stretch in your lower back or buttocks. ?Hold the stretch for 10-30 seconds. ?Slowly release and straighten your leg. ? ?Pelvic tilt ?Repeat these steps 5-10 times: ?Lie on your back on a firm bed or the floor with your  legs extended. ?Bend your knees so they are pointing toward the ceiling and your feet are flat on the floor. ?Tighten your lower abdominal muscles to press your lower back against the floor. This motion will tilt your pelvis so your tailbone points up toward the ceiling instead of pointing to your feet or the floor. ?With gentle tension and even breathing, hold this position for 5-10 seconds. ? ?Cat-cow ?Repeat these steps until your lower back becomes more flexible: ?Get into a hands-and-knees position on a firm bed or the floor. Keep your hands under your shoulders, and keep your knees under your hips. You may place padding under your knees for comfort. ?Let your head hang down toward your chest. Contract your abdominal muscles and point your tailbone toward the floor so your lower back becomes rounded like the back of a cat. ?Hold this position for 5 seconds. ?Slowly lift your head, let your abdominal muscles relax, and point your tailbone up toward the ceiling so your back forms a sagging arch like the back of a cow. ?Hold this position for 5 seconds. ? ?Press-ups ?Repeat these steps 5-10 times: ?Lie on your abdomen (face-down) on a firm bed or the floor. ?Place your palms near your head, about shoulder-width apart. ?Keeping your back as relaxed as possible and keeping your hips on the floor, slowly straighten your arms to raise the top half of your body and lift your shoulders. Do not use your back muscles to raise your  upper torso. You may adjust the placement of your hands to make yourself more comfortable. ?Hold this position for 5 seconds while you keep your back relaxed. ?Slowly return to lying flat on the floor. ? ?Bridges ?Repeat these steps 10 times: ?Lie on your back on a firm bed or the floor. ?Bend your knees so they are pointing toward the ceiling and your feet are flat on the floor. Your arms should be flat at your sides, next to your body. ?Tighten your buttocks muscles and lift your buttocks off  the floor until your waist is at almost the same height as your knees. You should feel the muscles working in your buttocks and the back of your thighs. If you do not feel these muscles, slide your feet 1-2 inches (2.5-5 cm) farther away from your buttocks. ?Hold this position for 3-5 seconds. ?Slowly lower your hips to the starting position, and allow your buttocks muscles to relax completely. ?If this exercise is too easy, try doing it with your arms crossed over your chest. ?Abdominal crunches ?Repeat these steps 5-10 times: ?Lie on your back on a firm bed or the floor with your legs extended. ?Bend your knees so they are pointing toward the ceiling and your feet are flat on the floor. ?Cross your arms over your chest. ?Tip your chin slightly toward your chest without bending your neck. ?Tighten your abdominal muscles and slowly raise your torso high enough to lift your shoulder blades a tiny bit off the floor. Avoid raising your torso higher than that because it can put too much stress on your lower back and does not help to strengthen your abdominal muscles. ?Slowly return to your starting position. ? ?Back lifts ?Repeat these steps 5-10 times: ?Lie on your abdomen (face-down) with your arms at your sides, and rest your forehead on the floor. ?Tighten the muscles in your legs and your buttocks. ?Slowly lift your chest off the floor while you keep your hips pressed to the floor. Keep the back of your head in line with the curve in your back. Your eyes should be looking at the floor. ?Hold this position for 3-5 seconds. ?Slowly return to your starting position. ? ?Contact a health care provider if: ?Your back pain or discomfort gets much worse when you do an exercise. ?Your worsening back pain or discomfort does not lessen within 2 hours after you exercise. ?If you have any of these problems, stop doing these exercises right away. Do not do them again unless your health care provider says that you can. ?Get help  right away if: ?You develop sudden, severe back pain. If this happens, stop doing the exercises right away. Do not do them again unless your health care provider says that you can. ?This information is not intended to replace advice given to you by your health care provider. Make sure you discuss any questions you have with your health care provider. ?Document Revised: 06/28/2020 Document Reviewed: 03/16/2020 ?Elsevier Patient Education ? 2023 [employer] ?Iliotibial Band Syndrome Rehab ?Ask your health care provider which exercises are safe for you. Do exercises exactly as told by your health care provider and adjust them as directed. It is normal to feel mild stretching, pulling, tightness, or discomfort as you do these exercises. Stop right away if you feel sudden pain or your pain gets significantly worse. Do not begin these exercises until told by your health care provider. ?Stretching and range-of-motion exercises ?These exercises warm up your muscles and joints and  improve the movement and flexibility of your hip and pelvis. ?Quadriceps stretch, prone ? ?Lie on your abdomen (prone position) on a firm surface, such as a bed or padded floor. ?Bend your left / right knee and reach back to hold your ankle or pant leg. If you cannot reach your ankle or pant leg, loop a belt around your foot and grab the belt instead. ?Gently pull your heel toward your buttocks. Your knee should not slide out to the side. You should feel a stretch in the front of your thigh and knee (quadriceps). ?Hold this position for __________ seconds. ?Repeat __________ times. Complete this exercise __________ times a day. ?Iliotibial band stretch ?An iliotibial band is a strong band of muscle tissue that runs from the outer side of your hip to the outer side of your thigh and knee. ?Lie on your side with your left / right leg in the top position. ?Bend both of your knees and grab your left / right ankle. Stretch out your bottom arm to help  you balance. ?Slowly bring your top knee back so your thigh goes behind your trunk. ?Slowly lower your top leg toward the floor until you feel a gentle stretch on the outside of your left / right hip

## 2021-05-03 LAB — COMPLETE METABOLIC PANEL WITH GFR
AG Ratio: 1.2 (calc) (ref 1.0–2.5)
ALT: 23 U/L (ref 6–29)
AST: 21 U/L (ref 10–35)
Albumin: 4.3 g/dL (ref 3.6–5.1)
Alkaline phosphatase (APISO): 75 U/L (ref 37–153)
BUN/Creatinine Ratio: 13 (calc) (ref 6–22)
BUN: 18 mg/dL (ref 7–25)
CO2: 26 mmol/L (ref 20–32)
Calcium: 9.8 mg/dL (ref 8.6–10.4)
Chloride: 103 mmol/L (ref 98–110)
Creat: 1.34 mg/dL — ABNORMAL HIGH (ref 0.50–1.03)
Globulin: 3.6 g/dL (calc) (ref 1.9–3.7)
Glucose, Bld: 74 mg/dL (ref 65–99)
Potassium: 4 mmol/L (ref 3.5–5.3)
Sodium: 139 mmol/L (ref 135–146)
Total Bilirubin: 0.3 mg/dL (ref 0.2–1.2)
Total Protein: 7.9 g/dL (ref 6.1–8.1)
eGFR: 47 mL/min/{1.73_m2} — ABNORMAL LOW (ref >=60)

## 2021-05-03 LAB — PROTEIN / CREATININE RATIO, URINE
Creatinine, Urine: 94 mg/dL (ref 20–275)
Protein/Creat Ratio: 64 mg/g creat (ref 24–184)
Protein/Creatinine Ratio: 0.064 mg/mg creat (ref 0.024–0.184)
Total Protein, Urine: 6 mg/dL (ref 5–24)

## 2021-05-03 LAB — CBC WITH DIFFERENTIAL/PLATELET
Absolute Monocytes: 233 cells/uL (ref 200–950)
Basophils Absolute: 19 cells/uL (ref 0–200)
Basophils Relative: 0.8 %
Eosinophils Absolute: 60 cells/uL (ref 15–500)
Eosinophils Relative: 2.5 %
HCT: 33 % — ABNORMAL LOW (ref 35.0–45.0)
Hemoglobin: 10.1 g/dL — ABNORMAL LOW (ref 11.7–15.5)
Lymphs Abs: 778 cells/uL — ABNORMAL LOW (ref 850–3900)
MCH: 23.1 pg — ABNORMAL LOW (ref 27.0–33.0)
MCHC: 30.6 g/dL — ABNORMAL LOW (ref 32.0–36.0)
MCV: 75.3 fL — ABNORMAL LOW (ref 80.0–100.0)
MPV: 11.4 fL (ref 7.5–12.5)
Monocytes Relative: 9.7 %
Neutro Abs: 1310 cells/uL — ABNORMAL LOW (ref 1500–7800)
Neutrophils Relative %: 54.6 %
Platelets: 235 10*3/uL (ref 140–400)
RBC: 4.38 10*6/uL (ref 3.80–5.10)
RDW: 15.9 % — ABNORMAL HIGH (ref 11.0–15.0)
Total Lymphocyte: 32.4 %
WBC: 2.4 10*3/uL — ABNORMAL LOW (ref 3.8–10.8)

## 2021-05-03 LAB — C3 AND C4
C3 Complement: 102 mg/dL (ref 83–193)
C4 Complement: 40 mg/dL (ref 15–57)

## 2021-05-03 LAB — SEDIMENTATION RATE: Sed Rate: 14 mm/h (ref 0–30)

## 2021-05-03 LAB — ANTI-DNA ANTIBODY, DOUBLE-STRANDED: ds DNA Ab: 10 IU/mL — ABNORMAL HIGH

## 2021-05-04 NOTE — Progress Notes (Signed)
White cell count is low and stable.  Hemoglobin is low and stable.  Creatinine is elevated.  Double-stranded DNA is stable.  Complements are normal and sed rate is normal.  Urine protein is negative.  Labs do not indicate lupus flare.  Elevation in creatinine is most likely related to diuretic use.  Please forward labs to her PCP.

## 2021-06-07 ENCOUNTER — Telehealth: Payer: Self-pay | Admitting: *Deleted

## 2021-06-07 NOTE — Telephone Encounter (Signed)
Okay to fill paperwork.

## 2021-06-07 NOTE — Telephone Encounter (Signed)
Will complete paperwork once patient gets information sent to the office.

## 2021-06-07 NOTE — Telephone Encounter (Signed)
Spoke with patient and advised we last filled out her paperwork to work from home in December. Patient states she would like for [place] to complete it again so she can continue to work from home. Please advise.

## 2021-06-16 ENCOUNTER — Other Ambulatory Visit: Payer: Self-pay | Admitting: Physician Assistant

## 2021-06-16 DIAGNOSIS — M3219 Other organ or system involvement in systemic lupus erythematosus: Secondary | ICD-10-CM

## 2021-06-19 ENCOUNTER — Telehealth: Payer: Self-pay | Admitting: Rheumatology

## 2021-06-19 NOTE — Telephone Encounter (Signed)
Patient called requesting to speak with Stephanie Myers directly regarding her work from home paperwork.   Patient understands that Stephanie Myers is out of the office and will return her call tomorrow, 06/20/21.

## 2021-06-20 NOTE — Telephone Encounter (Signed)
Spoke with patient and she has received the paperwork to be completed and will e-mail a copy. Patient advised we will complete the paperwork as soon as we can. Patient expressed understanding.

## 2021-07-20 ENCOUNTER — Other Ambulatory Visit: Payer: Self-pay | Admitting: Physician Assistant

## 2021-07-20 DIAGNOSIS — M3219 Other organ or system involvement in systemic lupus erythematosus: Secondary | ICD-10-CM

## 2021-07-20 NOTE — Telephone Encounter (Signed)
Next Visit: 10/03/2021  Last Visit: 05/02/2021  Labs: 05/02/2021 White cell count is low and stable.  Hemoglobin is low and stable.  Creatinine is elevated.   Eye exam:  05/01/2021 WNL   Current Dose per office note 05/02/2021: Plaquenil 200 mg 1 tablet by mouth twice daily Monday through Friday only  SM:OLMBE organ or system involvement in systemic lupus erythematosus   Last Fill: 04/25/2021  Okay to refill Plaquenil?

## 2021-08-09 DIAGNOSIS — M79605 Pain in left leg: Secondary | ICD-10-CM | POA: Diagnosis not present

## 2021-08-09 DIAGNOSIS — F418 Other specified anxiety disorders: Secondary | ICD-10-CM | POA: Diagnosis not present

## 2021-08-09 DIAGNOSIS — R200 Anesthesia of skin: Secondary | ICD-10-CM | POA: Diagnosis not present

## 2021-08-09 DIAGNOSIS — E1129 Type 2 diabetes mellitus with other diabetic kidney complication: Secondary | ICD-10-CM | POA: Diagnosis not present

## 2021-08-24 DIAGNOSIS — N1831 Chronic kidney disease, stage 3a: Secondary | ICD-10-CM | POA: Diagnosis not present

## 2021-08-24 DIAGNOSIS — R809 Proteinuria, unspecified: Secondary | ICD-10-CM | POA: Diagnosis not present

## 2021-08-24 DIAGNOSIS — E785 Hyperlipidemia, unspecified: Secondary | ICD-10-CM | POA: Diagnosis not present

## 2021-08-24 DIAGNOSIS — F411 Generalized anxiety disorder: Secondary | ICD-10-CM | POA: Diagnosis not present

## 2021-08-24 DIAGNOSIS — E1129 Type 2 diabetes mellitus with other diabetic kidney complication: Secondary | ICD-10-CM | POA: Diagnosis not present

## 2021-08-24 DIAGNOSIS — Z1231 Encounter for screening mammogram for malignant neoplasm of breast: Secondary | ICD-10-CM | POA: Diagnosis not present

## 2021-09-08 ENCOUNTER — Other Ambulatory Visit: Payer: Self-pay | Admitting: Physician Assistant

## 2021-09-08 DIAGNOSIS — M3219 Other organ or system involvement in systemic lupus erythematosus: Secondary | ICD-10-CM

## 2021-09-19 NOTE — Progress Notes (Unsigned)
Office Visit Note  Patient: Stephanie Myers             Date of Birth: 1965/06/13           MRN: [id number]             PCP: Jefm Petty, MD Referring: Jefm Petty, MD Visit Date: 10/03/2021 Occupation: _0 @  Subjective:  Trochanteric bursitis of both hips   History of Present Illness: Stephanie Myers is a 56 y.o. female with history of systemic lupus erythematosus and osteoarthritis.  Patient is currently taking Plaquenil 200 mg 1 tablet by mouth twice daily Monday through Friday.  She is tolerating Plaquenil without any side effects and has not missed any doses recently.  She denies any signs or symptoms of a systemic lupus flare.  She has not had any recent rashes.  She wears sunscreen on a daily basis and avoids direct sun exposure if possible.  She denies any symptoms of Raynaud's.  She has not noticed any signs of alopecia.  She denies any increased fatigue.  She has not had any shortness of breath, pleuritic chest pain, or palpitations.  She denies any oral or nasal ulcerations.  She has not had any sicca symptoms.  She continues to see her nephrologist on a yearly basis. She presents today with ongoing discomfort on the lateral aspect of both hips.  Her discomfort is exacerbated by sitting for prolonged periods of time or laying on her sides at night.  The right trochanteric bursa has been more sore than the right side.  She has tried home exercises but has not yet tried physical therapy or cortisone injection. She denies any other joint pain or joint swelling at this time.   Activities of Daily Living:  Patient reports morning stiffness for few minutes  Patient Reports nocturnal pain.  Difficulty dressing/grooming: Denies Difficulty climbing stairs: Reports Difficulty getting out of chair: Reports Difficulty using hands for taps, buttons, cutlery, and/or writing: Denies  Review of Systems  Constitutional:  Negative for fatigue.  HENT:  Negative for mouth sores, mouth  dryness and nose dryness.   Eyes:  Negative for pain, visual disturbance and dryness.  Respiratory:  Negative for cough, hemoptysis, shortness of breath and difficulty breathing.   Cardiovascular:  Negative for chest pain, palpitations, hypertension and swelling in legs/feet.  Gastrointestinal:  Negative for blood in stool, constipation and diarrhea.  Endocrine: Negative for increased urination.  Genitourinary:  Negative for painful urination.  Musculoskeletal:  Positive for joint pain and joint pain. Negative for joint swelling, myalgias, muscle weakness, morning stiffness, muscle tenderness and myalgias.  Skin:  Negative for color change, pallor, rash, hair loss, nodules/bumps, skin tightness, ulcers and sensitivity to sunlight.  Allergic/Immunologic: Negative for susceptible to infections.  Neurological:  Negative for dizziness, numbness, headaches and weakness.  Hematological:  Negative for swollen glands.  Psychiatric/Behavioral:  Negative for depressed mood and sleep disturbance. The patient is not nervous/anxious.     PMFS History:  Patient Active Problem List   Diagnosis Date Noted   Essential hypertension 04/17/2016   Other organ or system involvement in systemic lupus erythematosus ([place]) 12/20/2015   High risk medication use 12/20/2015   Osteoarthritis, hand 12/20/2015   Osteoarthritis of foot 12/20/2015   Bilateral primary osteoarthritis of knee 12/20/2015    Past Medical History:  Diagnosis Date   Anemia    Diabetes mellitus without complication ([place])    Hyperlipidemia    Hypertension    Lupus ([place])  Shingles    Systemic lupus erythematosus ([place])     Family History  Problem Relation Age of Onset   Healthy Daughter    Healthy Son    Past Surgical History:  Procedure Laterality Date   ABDOMINAL HYSTERECTOMY     CESAREAN SECTION     Social History   Social History Narrative   Not on file   Immunization History  Administered Date(s) Administered    Hepatitis B 12/05/2016, 06/04/2017, 12/26/2017   Influenza,inj,Quad PF,6+ Mos 10/07/2015, 12/05/2016, 12/26/2017   PFIZER Comirnaty([place])Covid-19 Tri-Sucrose Vaccine 04/05/2019, 04/26/2019   Pneumococcal Polysaccharide-23 03/05/2011   Tdap 03/05/2011     Objective: Vital Signs: BP 106/71 (BP Location: Left Arm, Patient Position: Sitting, Cuff Size: Normal)   Pulse 91   Resp 15   Ht 5' 4.5" (1.638 m)   Wt 188 lb (85.3 kg)   BMI 31.77 kg/m    Physical Exam Vitals and nursing note reviewed.  Constitutional:      Appearance: She is well-developed.  HENT:     Head: Normocephalic and atraumatic.  Eyes:     Conjunctiva/sclera: Conjunctivae normal.  Cardiovascular:     Rate and Rhythm: Normal rate and regular rhythm.     Heart sounds: Normal heart sounds.  Pulmonary:     Effort: Pulmonary effort is normal.     Breath sounds: Normal breath sounds.  Abdominal:     General: Bowel sounds are normal.     Palpations: Abdomen is soft.  Musculoskeletal:     Cervical back: Normal range of motion.  Skin:    General: Skin is warm and dry.     Capillary Refill: Capillary refill takes less than 2 seconds.  Neurological:     Mental Status: She is alert and oriented to person, place, and time.  Psychiatric:        Behavior: Behavior normal.      Musculoskeletal Exam: C-spine, thoracic spine, lumbar spine have good range of motion.  Shoulder joints, elbow joints, wrist joints, MCPs, PIPs, DIPs have good range of motion with no synovitis.  Complete fist formation bilaterally.  Hip joints have good range of motion with no groin pain.  Tenderness over trochanteric bursa bilaterally.  Knee joints have good range of motion with no warmth or effusion.  Ankle joints have good range of motion with no tenderness or joint swelling.  No tenderness over MTP joints.   CDAI Exam: CDAI Score: -- Patient Global: --; Provider Global: -- Swollen: --; Tender: -- Joint Exam 10/03/2021   No joint exam  has been documented for this visit   There is currently no information documented on the homunculus. Go to the Rheumatology activity and complete the homunculus joint exam.  Investigation: No additional findings.  Imaging: No results found.  Recent Labs: Lab Results  Component Value Date   WBC 2.4 (L) 05/02/2021   HGB 10.1 (L) 05/02/2021   PLT 235 05/02/2021   NA 139 05/02/2021   K 4.0 05/02/2021   CL 103 05/02/2021   CO2 26 05/02/2021   GLUCOSE 74 05/02/2021   BUN 18 05/02/2021   CREATININE 1.34 (H) 05/02/2021   BILITOT 0.3 05/02/2021   ALKPHOS 69 09/03/2016   AST 21 05/02/2021   ALT 23 05/02/2021   PROT 7.9 05/02/2021   ALBUMIN 4.5 09/03/2016   CALCIUM 9.8 05/02/2021   GFRAA 65 07/05/2020    Speciality Comments: PLQ Eye Exam: 05/01/2021 WNL @ [facility] follow up in 6 months  Procedures:  No  procedures performed Allergies: Lisinopril    Assessment / Plan:     Visit Diagnoses: Other organ or system involvement in systemic lupus erythematosus ([place]) - +ANA, +ds DNA, + Smith, RNP and Ro: She has not had any signs or symptoms of a systemic lupus flare.  She is clinically doing well taking Plaquenil 200 mg 1 tablet by mouth twice daily Monday through Friday.  She has been tolerating Plaquenil without any side effects and has not missed any doses recently.  She has no synovitis on examination today.  She has not had any recent rashes or signs of alopecia.  She has been trying to avoid direct sun exposure and wear sunscreen on a daily basis.  No malar rash noted. She has not had any symptoms of Raynaud's phenomenon and digital ulcerations or signs of gangrene were noted.  She has not had any oral or nasal ulcerations.  No sicca symptoms.  No cervical lymphadenopathy.  Her energy level has been stable.  She has not had any shortness of breath, pleuritic chest pain, palpitations.  Her lungs were clear to auscultation today. Autoimmune lab work drawn on 05/02/2021: Protein  creatinine ratio WNL, ESR 14, dsDNA 10 (trending down), complements WNL, WBC count 2.4, hgb 10.1, Hct 33.0.  Renal function is monitored by nephrology. The following lab work will be updated today for further evaluation.  She will remain on Plaquenil as prescribed. A refill of plaquenil was sent to the pharmacy. She was advised to notify us if she develops signs or symptoms of a flare.  She will follow-up in the office in 5 months or sooner if needed.  - Plan: hydroxychloroquine (PLAQUENIL) 200 MG tablet, CBC with Differential/Platelet, COMPLETE METABOLIC PANEL WITH GFR, Protein / creatinine ratio, urine, ANA, Anti-DNA antibody, double-stranded, C3 and C4, Sedimentation rate  High risk medication use - Plaquenil 200 mg 1 tablet by mouth twice daily Monday through Friday only. CBC and CMP updated on 08/24/2021.  Orders for CBC and CMP released today.  PLQ Eye Exam: 05/01/2021 WNL @ [facility] follow up in 6 months  - Plan: CBC with Differential/Platelet, COMPLETE METABOLIC PANEL WITH GFR  Primary osteoarthritis of both hands: No tenderness or synovitis noted.  Complete fist formation bilaterally.  Bilateral primary osteoarthritis of knee: She has good ROM of both knee joints with no discomfort.  No warmth or effusion noted.   Primary osteoarthritis of both feet: She is not experiencing any discomfort in her feet at this time.  She has good ROM of both ankle joints with no tenderness or joint swelling.   Trochanteric bursitis of both hips: She presents today with ongoing discomfort in both hips consistent with trochanteric bursitis.  Her discomfort is exacerbated by lying on her sides at night as well as sitting for prolonged periods of time.  She is not experiencing any groin pain currently.  On exam she has good range of motion of both hip joints with no groin pain.  She has tenderness palpation over bilateral trochanteric bursa and along the left IT band.  Different treatment options were discussed  today including home exercises, physical therapy, and a cortisone injection.  She would like to continue home exercises.  She will notify us if her symptoms persist or worsen at which time we can place referral to PT.  Lumbar facet arthropathy: She has no midline spinal tenderness.  No symptoms of radiculopathy.   Essential hypertension: BP was 106/71 today in the office.   Orders: Orders Placed This  Encounter  Procedures   CBC with Differential/Platelet   COMPLETE METABOLIC PANEL WITH GFR   Protein / creatinine ratio, urine   ANA   Anti-DNA antibody, double-stranded   C3 and C4   Sedimentation rate   Meds ordered this encounter  Medications   hydroxychloroquine (PLAQUENIL) 200 MG tablet    Sig: Take 1 tablet by month twice daily Monday through Friday only.    Dispense:  120 tablet    Refill:  0     Follow-Up Instructions: Return in about 5 months (around 03/05/2022) for Systemic lupus erythematosus.   Ofilia Neas, PA-C  Note - This record has been created using Dragon software.  Chart creation errors have been sought, but may not always  have been located. Such creation errors do not reflect on  the standard of medical care.

## 2021-09-28 DIAGNOSIS — M329 Systemic lupus erythematosus, unspecified: Secondary | ICD-10-CM | POA: Diagnosis not present

## 2021-09-28 DIAGNOSIS — N1831 Chronic kidney disease, stage 3a: Secondary | ICD-10-CM | POA: Diagnosis not present

## 2021-09-28 DIAGNOSIS — I129 Hypertensive chronic kidney disease with stage 1 through stage 4 chronic kidney disease, or unspecified chronic kidney disease: Secondary | ICD-10-CM | POA: Diagnosis not present

## 2021-09-28 DIAGNOSIS — N179 Acute kidney failure, unspecified: Secondary | ICD-10-CM | POA: Diagnosis not present

## 2021-10-03 ENCOUNTER — Ambulatory Visit: Payer: BC Managed Care – PPO | Attending: Physician Assistant | Admitting: Physician Assistant

## 2021-10-03 ENCOUNTER — Encounter: Payer: Self-pay | Admitting: Physician Assistant

## 2021-10-03 VITALS — BP 106/71 | HR 91 | Resp 15 | Ht 64.5 in | Wt 188.0 lb

## 2021-10-03 DIAGNOSIS — M7061 Trochanteric bursitis, right hip: Secondary | ICD-10-CM

## 2021-10-03 DIAGNOSIS — Z79899 Other long term (current) drug therapy: Secondary | ICD-10-CM

## 2021-10-03 DIAGNOSIS — M19071 Primary osteoarthritis, right ankle and foot: Secondary | ICD-10-CM

## 2021-10-03 DIAGNOSIS — M170 Bilateral primary osteoarthritis of knee: Secondary | ICD-10-CM

## 2021-10-03 DIAGNOSIS — M3219 Other organ or system involvement in systemic lupus erythematosus: Secondary | ICD-10-CM

## 2021-10-03 DIAGNOSIS — M19041 Primary osteoarthritis, right hand: Secondary | ICD-10-CM

## 2021-10-03 DIAGNOSIS — M47816 Spondylosis without myelopathy or radiculopathy, lumbar region: Secondary | ICD-10-CM

## 2021-10-03 DIAGNOSIS — I10 Essential (primary) hypertension: Secondary | ICD-10-CM

## 2021-10-03 DIAGNOSIS — M19072 Primary osteoarthritis, left ankle and foot: Secondary | ICD-10-CM

## 2021-10-03 DIAGNOSIS — M19042 Primary osteoarthritis, left hand: Secondary | ICD-10-CM

## 2021-10-03 DIAGNOSIS — M7062 Trochanteric bursitis, left hip: Secondary | ICD-10-CM

## 2021-10-03 MED ORDER — HYDROXYCHLOROQUINE SULFATE 200 MG PO TABS: ORAL_TABLET | ORAL | 0 refills | Status: DC

## 2021-10-03 NOTE — Patient Instructions (Signed)
Hip Bursitis Rehab Ask your health care provider which exercises are safe for you. Do exercises exactly as told by your health care provider and adjust them as directed. It is normal to feel mild stretching, pulling, tightness, or discomfort as you do these exercises. Stop right away if you feel sudden pain or your pain gets worse. Do not begin these exercises until told by your health care provider. Stretching exercise This exercise warms up your muscles and joints and improves the movement and flexibility of your hip. This exercise also helps to relieve pain and stiffness. Iliotibial band stretch An iliotibial band is a strong band of muscle tissue that runs from the outer side of your hip to the outer side of your thigh and knee. Lie on your side with your left / right leg in the top position. Bend your left / right knee and grab your ankle. Stretch out your bottom arm to help you balance. Slowly bring your knee back so your thigh is slightly behind your body. Slowly lower your knee toward the floor until you feel a gentle stretch on the outside of your left / right thigh. If you do not feel a stretch and your knee will not lower more toward the floor, place the heel of your other foot on top of your knee and pull your knee down toward the floor with your foot. Hold this position for __________ seconds. Slowly return to the starting position. Repeat __________ times. Complete this exercise __________ times a day. Strengthening exercises These exercises build strength and endurance in your hip and pelvis. Endurance is the ability to use your muscles for a long time, even after they get tired. Bridge This exercise strengthens the muscles that move your thigh backward (hip extensors). Lie on your back on a firm surface with your knees bent and your feet flat on the floor. Tighten your buttocks muscles and lift your buttocks off the floor until your trunk is level with your thighs. Do not arch your  back. You should feel the muscles working in your buttocks and the back of your thighs. If you do not feel these muscles, slide your feet 1-2 inches (2.5-5 cm) farther away from your buttocks. If this exercise is too easy, try doing it with your arms crossed over your chest. Hold this position for __________ seconds. Slowly lower your hips to the starting position. Let your muscles relax completely after each repetition. Repeat __________ times. Complete this exercise __________ times a day. Squats This exercise strengthens the muscles in front of your thigh and knee (quadriceps). Stand in front of a table, with your feet and knees pointing straight ahead. You may rest your hands on the table for balance but not for support. Slowly bend your knees and lower your hips like you are going to sit in a chair. Keep your weight over your heels, not over your toes. Keep your lower legs upright so they are parallel with the table legs. Do not let your hips go lower than your knees. Do not bend lower than told by your health care provider. If your hip pain increases, do not bend as low. Hold the squat position for __________ seconds. Slowly push with your legs to return to standing. Do not use your hands to pull yourself to standing. Repeat __________ times. Complete this exercise __________ times a day. Hip hike  Stand sideways on a bottom step. Stand on your left / right leg with your other foot unsupported next to   the step. You can hold on to the railing or wall for balance if needed. Keep your knees straight and your torso square. Then lift your left / right hip up toward the ceiling. Hold this position for __________ seconds. Slowly let your left / right hip lower toward the floor, past the starting position. Your foot should get closer to the floor. Do not lean or bend your knees. Repeat __________ times. Complete this exercise __________ times a day. Single leg stand This exercise increases  your balance. Without shoes, stand near a railing or in a doorway. You may hold on to the railing or door frame as needed for balance. Squeeze your left / right buttock muscles, then lift up your other foot. Do not let your left / right hip push out to the side. It is helpful to stand in front of a mirror for this exercise so you can watch your hip. Hold this position for __________ seconds. Repeat __________ times. Complete this exercise __________ times a day. This information is not intended to replace advice given to you by your health care provider. Make sure you discuss any questions you have with your health care provider. Document Revised: 12/14/2020 Document Reviewed: 12/14/2020 Elsevier Patient Education  2023 [employer]  

## 2021-10-05 NOTE — Progress Notes (Signed)
White cell count is low and stable.  Hemoglobin is low at 9.8.  Creatinine is elevated at 1.28.  Complements are normal, sed rate is normal.  Urine protein is negative.  Labs do not indicate autoimmune disease flare.  Patient should discuss low hemoglobin and elevated creatinine with her PCP.  Please forward results to her PCP.

## 2021-10-07 LAB — PROTEIN / CREATININE RATIO, URINE
Creatinine, Urine: 41 mg/dL (ref 20–275)
Total Protein, Urine: <4 mg/dL — ABNORMAL LOW (ref 5–24)

## 2021-10-07 LAB — CBC WITH DIFFERENTIAL/PLATELET
Absolute Monocytes: 269 cells/uL (ref 200–950)
Basophils Absolute: 20 cells/uL (ref 0–200)
Basophils Relative: 0.7 %
Eosinophils Absolute: 90 cells/uL (ref 15–500)
Eosinophils Relative: 3.2 %
HCT: 32.1 % — ABNORMAL LOW (ref 35.0–45.0)
Hemoglobin: 9.8 g/dL — ABNORMAL LOW (ref 11.7–15.5)
Lymphs Abs: 938 cells/uL (ref 850–3900)
MCH: 23 pg — ABNORMAL LOW (ref 27.0–33.0)
MCHC: 30.5 g/dL — ABNORMAL LOW (ref 32.0–36.0)
MCV: 75.4 fL — ABNORMAL LOW (ref 80.0–100.0)
MPV: 10.7 fL (ref 7.5–12.5)
Monocytes Relative: 9.6 %
Neutro Abs: 1484 cells/uL — ABNORMAL LOW (ref 1500–7800)
Neutrophils Relative %: 53 %
Platelets: 238 10*3/uL (ref 140–400)
RBC: 4.26 10*6/uL (ref 3.80–5.10)
RDW: 15.2 % — ABNORMAL HIGH (ref 11.0–15.0)
Total Lymphocyte: 33.5 %
WBC: 2.8 10*3/uL — ABNORMAL LOW (ref 3.8–10.8)

## 2021-10-07 LAB — ANTI-DNA ANTIBODY, DOUBLE-STRANDED: ds DNA Ab: 15 IU/mL — ABNORMAL HIGH

## 2021-10-07 LAB — SEDIMENTATION RATE: Sed Rate: 14 mm/h (ref 0–30)

## 2021-10-07 LAB — COMPLETE METABOLIC PANEL WITH GFR
AG Ratio: 1.2 (calc) (ref 1.0–2.5)
ALT: 21 U/L (ref 6–29)
AST: 20 U/L (ref 10–35)
Albumin: 4.4 g/dL (ref 3.6–5.1)
Alkaline phosphatase (APISO): 73 U/L (ref 37–153)
BUN/Creatinine Ratio: 13 (calc) (ref 6–22)
BUN: 17 mg/dL (ref 7–25)
CO2: 28 mmol/L (ref 20–32)
Calcium: 9.8 mg/dL (ref 8.6–10.4)
Chloride: 104 mmol/L (ref 98–110)
Creat: 1.28 mg/dL — ABNORMAL HIGH (ref 0.50–1.03)
Globulin: 3.7 g/dL (calc) (ref 1.9–3.7)
Glucose, Bld: 79 mg/dL (ref 65–99)
Potassium: 4.1 mmol/L (ref 3.5–5.3)
Sodium: 139 mmol/L (ref 135–146)
Total Bilirubin: 0.2 mg/dL (ref 0.2–1.2)
Total Protein: 8.1 g/dL (ref 6.1–8.1)
eGFR: 49 mL/min/{1.73_m2} — ABNORMAL LOW (ref >=60)

## 2021-10-07 LAB — ANA: Anti Nuclear Antibody (ANA): POSITIVE — AB

## 2021-10-07 LAB — ANTI-NUCLEAR AB-TITER (ANA TITER): ANA Titer 1: 1:320 {titer} — ABNORMAL HIGH

## 2021-10-07 LAB — C3 AND C4
C3 Complement: 96 mg/dL (ref 83–193)
C4 Complement: 34 mg/dL (ref 15–57)

## 2021-11-06 DIAGNOSIS — Z79899 Other long term (current) drug therapy: Secondary | ICD-10-CM | POA: Diagnosis not present

## 2021-11-06 DIAGNOSIS — M328 Other forms of systemic lupus erythematosus: Secondary | ICD-10-CM | POA: Diagnosis not present

## 2021-11-16 DIAGNOSIS — N179 Acute kidney failure, unspecified: Secondary | ICD-10-CM | POA: Diagnosis not present

## 2021-11-30 ENCOUNTER — Telehealth: Payer: Self-pay | Admitting: *Deleted

## 2021-11-30 NOTE — Telephone Encounter (Signed)
Patient contacted the office stating it is time for her work from home paperwork to be completed again. Patient to email paperwork to be completed. Patient advised we will fax paperwork once completed.

## 2021-12-12 ENCOUNTER — Telehealth: Payer: Self-pay | Admitting: *Deleted

## 2021-12-12 NOTE — Telephone Encounter (Signed)
Patient contacted the office to make sure we receive her work from home paperwork.  Returned call to patient to advised we did receive her paperwork and it is completed and waiting on Dr. Corliss Skains to sign it. Advised once signed we will fax it. Patient expressed understanding.

## 2021-12-29 ENCOUNTER — Other Ambulatory Visit: Payer: Self-pay | Admitting: Physician Assistant

## 2021-12-29 DIAGNOSIS — M3219 Other organ or system involvement in systemic lupus erythematosus: Secondary | ICD-10-CM

## 2021-12-29 NOTE — Telephone Encounter (Signed)
Next Visit: 03/08/2022  Last Visit: 10/03/2021  Labs: 10/03/2021 White cell count is low and stable.  Hemoglobin is low at 9.8.  Creatinine is elevated at 1.28.     Eye exam: 05/01/2021 WNL    Current Dose per office note 10/03/2021: Plaquenil 200 mg 1 tablet by mouth twice daily Monday through Friday only.   CB:ULAGT organ or system involvement in systemic lupus erythematosus   Last Fill: 10/03/2021  Okay to refill Plaquenil?

## 2022-02-17 ENCOUNTER — Other Ambulatory Visit: Payer: Self-pay | Admitting: Physician Assistant

## 2022-02-17 DIAGNOSIS — M3219 Other organ or system involvement in systemic lupus erythematosus: Secondary | ICD-10-CM

## 2022-02-22 NOTE — Progress Notes (Signed)
Office Visit Note  Patient: Stephanie Myers             Date of Birth: [birth year]           MRN: [id number]             PCP: Jefm Petty, MD Referring: Jefm Petty, MD Visit Date: 03/08/2022 Occupation: @GUAROCC$ @  Subjective:  Medication Maintenance   History of Present Illness: Stephanie Myers is a 57 y.o. female with history of systemic lupus erythematosus, osteoarthritis and trochanteric bursitis. She continues to take plaquenil 200 mg BID M-F and denies any side effects from the medication. She is scheduled to follow up with ophthalmology on 03/21/2022. She also follows up with Dr. Johnney Ou in nephrology. She continues to have pain from trochanteric bursitis, left greater than the right when she lays on her side. She's been doing the IT band exercises with some improvement. She doesn't feel she is ready for a steroid injection. She denies any hair loss, rashes, raynauds phenomenon. History of osteoarthritis of the hands, feet and knees but these have not been bothersome recently.    Activities of Daily Living:  Patient reports morning stiffness for 5 minutes.   Patient Denies nocturnal pain.  Difficulty dressing/grooming: Denies Difficulty climbing stairs: Denies Difficulty getting out of chair: Denies Difficulty using hands for taps, buttons, cutlery, and/or writing: Denies  Review of Systems  Constitutional:  Positive for fatigue.  HENT: Negative.  Negative for mouth sores and mouth dryness.   Eyes: Negative.  Negative for dryness.  Respiratory: Negative.  Negative for shortness of breath.   Cardiovascular: Negative.  Negative for chest pain and palpitations.  Gastrointestinal: Negative.  Negative for blood in stool, constipation and diarrhea.  Endocrine: Negative.  Negative for increased urination.  Genitourinary: Negative.  Negative for involuntary urination.  Musculoskeletal:  Positive for joint pain, joint pain and morning stiffness. Negative for gait problem, joint  swelling, myalgias, muscle weakness, muscle tenderness and myalgias.  Skin: Negative.  Negative for color change, rash, hair loss and sensitivity to sunlight.  Allergic/Immunologic: Negative for susceptible to infections.  Neurological: Negative.  Negative for dizziness and headaches.  Hematological: Negative.  Negative for swollen glands.  Psychiatric/Behavioral:  Negative for depressed mood and sleep disturbance. The patient is nervous/anxious.     PMFS History:  Patient Active Problem List   Diagnosis Date Noted   Essential hypertension 04/17/2016   Other organ or system involvement in systemic lupus erythematosus ([place]) 12/20/2015   High risk medication use 12/20/2015   Osteoarthritis, hand 12/20/2015   Osteoarthritis of foot 12/20/2015   Bilateral primary osteoarthritis of knee 12/20/2015    Past Medical History:  Diagnosis Date   Anemia    Diabetes mellitus without complication ([place])    Hyperlipidemia    Hypertension    Lupus ([place])    Shingles    Systemic lupus erythematosus ([place])     Family History  Problem Relation Age of Onset   Healthy Daughter    Healthy Son    Past Surgical History:  Procedure Laterality Date   ABDOMINAL HYSTERECTOMY     CESAREAN SECTION     Social History   Social History Narrative   Not on file   Immunization History  Administered Date(s) Administered   Hepatitis B 12/05/2016, 06/04/2017, 12/26/2017   Influenza,inj,Quad PF,6+ Mos 10/07/2015, 12/05/2016, 12/26/2017   PFIZER Comirnaty(Gray Top)Covid-19 Tri-Sucrose Vaccine 04/05/2019, 04/26/2019   PFIZER(Purple Top)SARS-COV-2 Vaccination 04/05/2019, 04/26/2019   Pneumococcal Polysaccharide-23 03/05/2011   Tdap 03/05/2011  Objective: Vital Signs: BP 102/71 (BP Location: Left Arm, Patient Position: Sitting, Cuff Size: Large)   Pulse 97   Resp 16   Ht 5' 4.5" (1.638 m)   Wt 190 lb (86.2 kg)   BMI 32.11 kg/m    Physical Exam Vitals and nursing note reviewed.  Constitutional:       Appearance: She is well-developed.  HENT:     Head: Normocephalic and atraumatic.  Eyes:     Conjunctiva/sclera: Conjunctivae normal.  Cardiovascular:     Rate and Rhythm: Normal rate and regular rhythm.     Heart sounds: Normal heart sounds.  Pulmonary:     Effort: Pulmonary effort is normal.     Breath sounds: Normal breath sounds.  Abdominal:     General: Bowel sounds are normal.     Palpations: Abdomen is soft.  Musculoskeletal:     Cervical back: Normal range of motion.  Lymphadenopathy:     Cervical: No cervical adenopathy.  Skin:    General: Skin is warm and dry.     Capillary Refill: Capillary refill takes less than 2 seconds.  Neurological:     Mental Status: She is alert and oriented to person, place, and time.  Psychiatric:        Behavior: Behavior normal.     Musculoskeletal Exam: Cervical spine was in good range of motion. Shoulder joints, elbow joints, wrist joints, MCPs PIPs and DIPs have good range of motion with no synovitis, warmth or effusion. Hip joints, knee joints, ankles, MTPs and PIPs with good range of motion with no synovitis, warmth or effusion. Tenderness over the left trochanteric bursa.   CDAI Exam: CDAI Score: -- Patient Global: --; Provider Global: -- Swollen: --; Tender: -- Joint Exam 03/08/2022   No joint exam has been documented for this visit   There is currently no information documented on the homunculus. Go to the Rheumatology activity and complete the homunculus joint exam.  Investigation: No additional findings.  Imaging: No results found.  Recent Labs: Lab Results  Component Value Date   WBC 2.8 (L) 10/03/2021   HGB 9.8 (L) 10/03/2021   PLT 238 10/03/2021   NA 139 10/03/2021   K 4.1 10/03/2021   CL 104 10/03/2021   CO2 28 10/03/2021   GLUCOSE 79 10/03/2021   BUN 17 10/03/2021   CREATININE 1.28 (H) 10/03/2021   BILITOT 0.2 10/03/2021   ALKPHOS 69 09/03/2016   AST 20 10/03/2021   ALT 21 10/03/2021   PROT 8.1  10/03/2021   ALBUMIN 4.5 09/03/2016   CALCIUM 9.8 10/03/2021   GFRAA 65 07/05/2020    Speciality Comments: PLQ Eye Exam: 05/01/2021 WNL @ [facility] follow up in 6 months  Procedures:  No procedures performed Allergies: Lisinopril   Assessment / Plan:     Visit Diagnoses: Other organ or system involvement in systemic lupus erythematosus ([place]) - +ANA, +ds DNA, + Smith, RNP and Ro. She has not had any signs or symptoms of a systemic lupus flare and has been doing well clinically. No rashes, joint pain, shortness of breath, raynauds phenomenon, lymph node swelling. She is on plaquenil 200 mg BID M-F and is tolerating the medication well. She has not missed any doses. No synovitis or rashes on physical exam. Educated patient on the importance of sunscreen.   High risk medication use - Plaquenil 200 mg 1 tablet by mouth twice daily Monday through Friday only. She has not missed any doses of the medication. PLQ Eye Exam:  05/01/2021 WNL. She is scheduled to follow up with ophthalmology on 03/21/2022. Provided ophthalmology form. Provided vaccination schedule in after visit summary. GFR in 10/03/2021 was 49 and creatinine was 1.28. She follows up with Dr. Johnney Ou in nephrology. Neutropenia has remained stable. Repeat ANA, Sedimentation rate, C3 and C4, Anti-DNA, double stranded, protein/creatinine ratio, CBC and CMP today and placed standing order to be repeated in 5 months prior to next office visit.   Primary osteoarthritis of both hands - No tenderness or synovitis noted. Complete fist formation bilaterally.   Trochanteric bursitis of both hips - Left bursa is still bothersome for the patient especially when she lays on that side. At her last office visit she was provided IT band exercises and states those have helped some. Tenderness over the left trochanteric bursa on physical exam today. Discussed the option of a steroid injection and the risks versus benefits. Patient declined today.  IT band  stretches were emphasized.  Bilateral primary osteoarthritis of knee - No discomfort in her knees at this time. Good ROM and no warmth, tenderness or effusion on physical exam.   Primary osteoarthritis of both feet - No discomfort in her feet at this time. Good ROM and no warmth, tenderness or effusion on physical exam.   Lumbar facet arthropathy - No symptoms at this time.   Essential hypertension - Blood pressure was 102/71 today. Followed Dr. Johnney Ou in nephrology.    History of systemic lupus erythematosus (SLE) ([place])  Orders: Orders Placed This Encounter  Procedures   Protein / creatinine ratio, urine   CBC with Differential/Platelet   COMPLETE METABOLIC PANEL WITH GFR   Anti-DNA antibody, double-stranded   C3 and C4   Sedimentation rate   No orders of the defined types were placed in this encounter.  Face-to-face time spent with patient was 30 minutes. Greater than 50% of time was spent in counseling and coordination of care.  Follow-Up Instructions: Return in about 5 months (around 08/06/2022) for Systemic lupus.  Bo Merino, MD  Note - This record has been created using Editor, commissioning.  Chart creation errors have been sought, but may not always  have been located. Such creation errors do not reflect on  the standard of medical care.

## 2022-03-08 ENCOUNTER — Encounter: Payer: Self-pay | Admitting: Rheumatology

## 2022-03-08 ENCOUNTER — Ambulatory Visit: Payer: BC Managed Care – PPO | Attending: Rheumatology | Admitting: Rheumatology

## 2022-03-08 VITALS — BP 102/71 | HR 97 | Resp 16 | Ht 64.5 in | Wt 190.0 lb

## 2022-03-08 DIAGNOSIS — M329 Systemic lupus erythematosus, unspecified: Secondary | ICD-10-CM

## 2022-03-08 DIAGNOSIS — M7061 Trochanteric bursitis, right hip: Secondary | ICD-10-CM

## 2022-03-08 DIAGNOSIS — M19072 Primary osteoarthritis, left ankle and foot: Secondary | ICD-10-CM

## 2022-03-08 DIAGNOSIS — Z79899 Other long term (current) drug therapy: Secondary | ICD-10-CM | POA: Diagnosis not present

## 2022-03-08 DIAGNOSIS — M170 Bilateral primary osteoarthritis of knee: Secondary | ICD-10-CM

## 2022-03-08 DIAGNOSIS — M3219 Other organ or system involvement in systemic lupus erythematosus: Secondary | ICD-10-CM | POA: Diagnosis not present

## 2022-03-08 DIAGNOSIS — M19041 Primary osteoarthritis, right hand: Secondary | ICD-10-CM | POA: Diagnosis not present

## 2022-03-08 DIAGNOSIS — M19042 Primary osteoarthritis, left hand: Secondary | ICD-10-CM

## 2022-03-08 DIAGNOSIS — I10 Essential (primary) hypertension: Secondary | ICD-10-CM

## 2022-03-08 DIAGNOSIS — M7062 Trochanteric bursitis, left hip: Secondary | ICD-10-CM

## 2022-03-08 DIAGNOSIS — M47816 Spondylosis without myelopathy or radiculopathy, lumbar region: Secondary | ICD-10-CM

## 2022-03-08 DIAGNOSIS — M19071 Primary osteoarthritis, right ankle and foot: Secondary | ICD-10-CM

## 2022-03-08 NOTE — Patient Instructions (Signed)
Standing Labs We placed an order today for your standing lab work.   Please have your standing labs drawn in July   Please have your labs drawn 2 weeks prior to your appointment so that the provider can discuss your lab results at your appointment.  Please note that you may see your imaging and lab results in [facility] before we have reviewed them. We will contact you once all results are reviewed. Please allow our office up to 72 hours to thoroughly review all of the results before contacting the office for clarification of your results.  Lab hours are:   Monday through Thursday from 8:00 am -12:30 pm and 1:00 pm-5:00 pm and Friday from 8:00 am-12:00 pm.  Please be advised, all patients with office appointments requiring labs will take precedent over walk-in labs.   Unfortunately you may encounter longer than normal wait times, possibly up to 1 hour. You may encounter a shorter wait time on Monday and Thursday afternoons.  We appreciate your patience and understanding.   Labs are drawn by [facility]. Please bring your co-pay at the time of your lab draw.  You may receive a bill from [facility] for your lab work.  Please note if you are on Hydroxychloroquine and and an order has been placed for a Hydroxychloroquine level, you will need to have it drawn 4 hours or more after your last dose.  If you wish to have your labs drawn at another location, please call the office 24 hours in advance so we can fax the orders.  The office is located at [address], [place], [place], [place] [address] No appointment is necessary.    If you have any questions regarding directions or hours of operation,  please call [phone].   As a reminder, please drink plenty of water prior to coming for your lab work. Thanks!   Vaccines You are taking a medication(s) that can suppress your immune system.  The following immunizations are recommended: Flu annually Covid-19  RSV Td/Tdap (tetanus, diphtheria,  pertussis) every 10 years Pneumonia (Prevnar 15 then Pneumovax 23 at least 1 year apart.  Alternatively, can take Prevnar 20 without needing additional dose) Shingrix: 2 doses from 4 weeks to 6 months apart  Please check with your PCP to make sure you are up to date.

## 2022-03-09 ENCOUNTER — Other Ambulatory Visit: Payer: Self-pay | Admitting: Physician Assistant

## 2022-03-09 DIAGNOSIS — M3219 Other organ or system involvement in systemic lupus erythematosus: Secondary | ICD-10-CM

## 2022-03-09 LAB — CBC WITH DIFFERENTIAL/PLATELET
Absolute Monocytes: 198 cells/uL — ABNORMAL LOW (ref 200–950)
Basophils Absolute: 31 cells/uL (ref 0–200)
Basophils Relative: 1 %
Eosinophils Absolute: 81 cells/uL (ref 15–500)
Eosinophils Relative: 2.6 %
HCT: 34.6 % — ABNORMAL LOW (ref 35.0–45.0)
Hemoglobin: 10.6 g/dL — ABNORMAL LOW (ref 11.7–15.5)
Lymphs Abs: 955 cells/uL (ref 850–3900)
MCH: 22.8 pg — ABNORMAL LOW (ref 27.0–33.0)
MCHC: 30.6 g/dL — ABNORMAL LOW (ref 32.0–36.0)
MCV: 74.6 fL — ABNORMAL LOW (ref 80.0–100.0)
MPV: 10.7 fL (ref 7.5–12.5)
Monocytes Relative: 6.4 %
Neutro Abs: 1835 cells/uL (ref 1500–7800)
Neutrophils Relative %: 59.2 %
Platelets: 244 10*3/uL (ref 140–400)
RBC: 4.64 10*6/uL (ref 3.80–5.10)
RDW: 14.8 % (ref 11.0–15.0)
Total Lymphocyte: 30.8 %
WBC: 3.1 10*3/uL — ABNORMAL LOW (ref 3.8–10.8)

## 2022-03-09 LAB — ANTI-DNA ANTIBODY, DOUBLE-STRANDED: ds DNA Ab: 24 IU/mL — ABNORMAL HIGH

## 2022-03-09 LAB — COMPLETE METABOLIC PANEL WITH GFR
AG Ratio: 1.1 (calc) (ref 1.0–2.5)
ALT: 16 U/L (ref 6–29)
AST: 19 U/L (ref 10–35)
Albumin: 4.6 g/dL (ref 3.6–5.1)
Alkaline phosphatase (APISO): 89 U/L (ref 37–153)
BUN/Creatinine Ratio: 17 (calc) (ref 6–22)
BUN: 21 mg/dL (ref 7–25)
CO2: 27 mmol/L (ref 20–32)
Calcium: 10.4 mg/dL (ref 8.6–10.4)
Chloride: 101 mmol/L (ref 98–110)
Creat: 1.23 mg/dL — ABNORMAL HIGH (ref 0.50–1.03)
Globulin: 4.2 g/dL (calc) — ABNORMAL HIGH (ref 1.9–3.7)
Glucose, Bld: 74 mg/dL (ref 65–99)
Potassium: 4.2 mmol/L (ref 3.5–5.3)
Sodium: 136 mmol/L (ref 135–146)
Total Bilirubin: 0.2 mg/dL (ref 0.2–1.2)
Total Protein: 8.8 g/dL — ABNORMAL HIGH (ref 6.1–8.1)
eGFR: 52 mL/min/{1.73_m2} — ABNORMAL LOW (ref >=60)

## 2022-03-09 LAB — PROTEIN / CREATININE RATIO, URINE
Creatinine, Urine: 45 mg/dL (ref 20–275)
Protein/Creat Ratio: 89 mg/g creat (ref 24–184)
Protein/Creatinine Ratio: 0.089 mg/mg creat (ref 0.024–0.184)
Total Protein, Urine: 4 mg/dL — ABNORMAL LOW (ref 5–24)

## 2022-03-09 LAB — C3 AND C4
C3 Complement: 119 mg/dL (ref 83–193)
C4 Complement: 47 mg/dL (ref 15–57)

## 2022-03-09 LAB — SEDIMENTATION RATE: Sed Rate: 19 mm/h (ref 0–30)

## 2022-03-09 NOTE — Telephone Encounter (Signed)
Next Visit: 08/06/2022  Last Visit: 03/08/2022  Labs: 03/08/2022 Creat. 1.23, GFR 52, Total Protein, Globulin 4.2, WBC 3.1, Hgb 10.6, Hct 34.6, MCV 74.6, MCH 22.8, MCHC 30.6, Absolute Monocytes 198  Eye exam: 05/01/2021 WNL   Current Dose per office note 03/08/2022: Plaquenil 200 mg 1 tablet by mouth twice daily Monday through Friday only   DX: Other organ or system involvement in systemic lupus erythematosus   Last Fill: 12/29/2021  Okay to refill Plaquenil?

## 2022-03-12 NOTE — Progress Notes (Signed)
WBC are low ane stable.Hemoglobin is low. Patient should take MVI with iron. GFR is low and stable. Urine Pro/ Cr ratio is normal. Ds DNA is elevated, C3, C4 and esr are normal. Labs do not indicate an autoimmune disease flare . We will continue to monitor labs.

## 2022-03-21 DIAGNOSIS — I129 Hypertensive chronic kidney disease with stage 1 through stage 4 chronic kidney disease, or unspecified chronic kidney disease: Secondary | ICD-10-CM | POA: Diagnosis not present

## 2022-03-21 DIAGNOSIS — N1831 Chronic kidney disease, stage 3a: Secondary | ICD-10-CM | POA: Diagnosis not present

## 2022-03-21 DIAGNOSIS — E1122 Type 2 diabetes mellitus with diabetic chronic kidney disease: Secondary | ICD-10-CM | POA: Diagnosis not present

## 2022-03-21 DIAGNOSIS — N179 Acute kidney failure, unspecified: Secondary | ICD-10-CM | POA: Diagnosis not present

## 2022-03-30 DIAGNOSIS — R202 Paresthesia of skin: Secondary | ICD-10-CM | POA: Diagnosis not present

## 2022-03-30 DIAGNOSIS — Z0001 Encounter for general adult medical examination with abnormal findings: Secondary | ICD-10-CM | POA: Diagnosis not present

## 2022-03-30 DIAGNOSIS — E1122 Type 2 diabetes mellitus with diabetic chronic kidney disease: Secondary | ICD-10-CM | POA: Diagnosis not present

## 2022-03-30 DIAGNOSIS — R809 Proteinuria, unspecified: Secondary | ICD-10-CM | POA: Diagnosis not present

## 2022-03-30 DIAGNOSIS — N1831 Chronic kidney disease, stage 3a: Secondary | ICD-10-CM | POA: Diagnosis not present

## 2022-03-30 DIAGNOSIS — Z23 Encounter for immunization: Secondary | ICD-10-CM | POA: Diagnosis not present

## 2022-04-02 ENCOUNTER — Encounter (HOSPITAL_BASED_OUTPATIENT_CLINIC_OR_DEPARTMENT_OTHER): Payer: Self-pay

## 2022-04-02 ENCOUNTER — Emergency Department (HOSPITAL_BASED_OUTPATIENT_CLINIC_OR_DEPARTMENT_OTHER): Payer: BC Managed Care – PPO

## 2022-04-02 ENCOUNTER — Emergency Department (HOSPITAL_BASED_OUTPATIENT_CLINIC_OR_DEPARTMENT_OTHER)
Admission: EM | Admit: 2022-04-02 | Discharge: 2022-04-02 | Disposition: A | Discharge status: Home / Self Care | Payer: BC Managed Care – PPO | Attending: Emergency Medicine | Admitting: Emergency Medicine

## 2022-04-02 DIAGNOSIS — Z7984 Long term (current) use of oral hypoglycemic drugs: Secondary | ICD-10-CM | POA: Insufficient documentation

## 2022-04-02 DIAGNOSIS — R202 Paresthesia of skin: Secondary | ICD-10-CM

## 2022-04-02 DIAGNOSIS — R200 Anesthesia of skin: Secondary | ICD-10-CM | POA: Diagnosis not present

## 2022-04-02 DIAGNOSIS — Z1152 Encounter for screening for COVID-19: Secondary | ICD-10-CM | POA: Insufficient documentation

## 2022-04-02 DIAGNOSIS — I10 Essential (primary) hypertension: Secondary | ICD-10-CM | POA: Diagnosis not present

## 2022-04-02 DIAGNOSIS — R0789 Other chest pain: Secondary | ICD-10-CM | POA: Diagnosis not present

## 2022-04-02 DIAGNOSIS — Z79899 Other long term (current) drug therapy: Secondary | ICD-10-CM | POA: Diagnosis not present

## 2022-04-02 DIAGNOSIS — Z7982 Long term (current) use of aspirin: Secondary | ICD-10-CM | POA: Diagnosis not present

## 2022-04-02 DIAGNOSIS — E119 Type 2 diabetes mellitus without complications: Secondary | ICD-10-CM | POA: Diagnosis not present

## 2022-04-02 LAB — CBC WITH DIFFERENTIAL/PLATELET
Abs Immature Granulocytes: 0.03 10*3/uL (ref 0.00–0.07)
Basophils Absolute: 0 10*3/uL (ref 0.0–0.1)
Basophils Relative: 1 %
Eosinophils Absolute: 0 10*3/uL (ref 0.0–0.5)
Eosinophils Relative: 1 %
HCT: 34.7 % — ABNORMAL LOW (ref 36.0–46.0)
Hemoglobin: 10.2 g/dL — ABNORMAL LOW (ref 12.0–15.0)
Immature Granulocytes: 1 %
Lymphocytes Relative: 14 %
Lymphs Abs: 0.6 10*3/uL — ABNORMAL LOW (ref 0.7–4.0)
MCH: 22.5 pg — ABNORMAL LOW (ref 26.0–34.0)
MCHC: 29.4 g/dL — ABNORMAL LOW (ref 30.0–36.0)
MCV: 76.6 fL — ABNORMAL LOW (ref 80.0–100.0)
Monocytes Absolute: 0.3 10*3/uL (ref 0.1–1.0)
Monocytes Relative: 7 %
Neutro Abs: 3.4 10*3/uL (ref 1.7–7.7)
Neutrophils Relative %: 76 %
Platelets: 247 10*3/uL (ref 150–400)
RBC: 4.53 MIL/uL (ref 3.87–5.11)
RDW: 15.7 % — ABNORMAL HIGH (ref 11.5–15.5)
WBC: 4.4 10*3/uL (ref 4.0–10.5)
nRBC: 0 % (ref 0.0–0.2)

## 2022-04-02 LAB — COMPREHENSIVE METABOLIC PANEL
ALT: 21 U/L (ref 0–44)
AST: 26 U/L (ref 15–41)
Albumin: 4.2 g/dL (ref 3.5–5.0)
Alkaline Phosphatase: 78 U/L (ref 38–126)
Anion gap: 9 (ref 5–15)
BUN: 22 mg/dL — ABNORMAL HIGH (ref 6–20)
CO2: 26 mmol/L (ref 22–32)
Calcium: 9.6 mg/dL (ref 8.9–10.3)
Chloride: 100 mmol/L (ref 98–111)
Creatinine, Ser: 1.26 mg/dL — ABNORMAL HIGH (ref 0.44–1.00)
GFR, Estimated: 50 mL/min — ABNORMAL LOW (ref >60)
Glucose, Bld: 83 mg/dL (ref 70–99)
Potassium: 4.8 mmol/L (ref 3.5–5.1)
Sodium: 135 mmol/L (ref 135–145)
Total Bilirubin: 0.3 mg/dL (ref 0.3–1.2)
Total Protein: 8.6 g/dL — ABNORMAL HIGH (ref 6.5–8.1)

## 2022-04-02 LAB — RESP PANEL BY RT-PCR (RSV, FLU A&B, COVID)  RVPGX2
Influenza A by PCR: NEGATIVE
Influenza B by PCR: NEGATIVE
Resp Syncytial Virus by PCR: NEGATIVE
SARS Coronavirus 2 by RT PCR: NEGATIVE

## 2022-04-02 LAB — TROPONIN I (HIGH SENSITIVITY): Troponin I (High Sensitivity): <2 ng/L (ref <18)

## 2022-04-02 LAB — LIPASE, BLOOD: Lipase: 54 U/L — ABNORMAL HIGH (ref 11–51)

## 2022-04-02 NOTE — ED Provider Notes (Signed)
[facility] Provider Note   CSN: [id number] Arrival date & time: 04/02/22  1027     History  Chief Complaint  Patient presents with   Numbness    Left arm     Stephanie Myers is a 57 y.o. female presented emergency department with left arm tingling.  The patient has a history of diabetes, hypertension, hyperlipidemia.  She reports that she went to her doctor's office today and abruptly became nauseated and had vomiting episodes in the office.  Those symptoms have resolved but she noted some paresthesias in her left arm.  She says she has had the symptoms of paresthesias on and off for several months, occurring at random, typically lasting a few minutes and then resolving.  Sometimes she will also feel them in her left upper leg.  She denies persistent headache.  She denies history of TIA or stroke.  She denies history of coronary disease.  She currently is experiencing some mild paresthesias.  Denies blurred vision  HPI     Home Medications Prior to Admission medications   Medication Sig Start Date End Date Taking? Authorizing Provider  Ascorbic Acid (VITAMIN C PO) Take by mouth daily.    [provider]  aspirin 81 MG tablet Take 81 mg by mouth daily.    [provider]  atorvastatin (LIPITOR) 10 MG tablet TAKE 1/2 TABLET BY MOUTH DAILY *MUST SCHEDULE AN APPOINTMENT FOR ADDITIONAL REFILLS* 04/09/16   [provider]  BLACK CURRANT SEED OIL PO Take by mouth daily.    [provider]  cetirizine (ZYRTEC) 10 MG tablet Take 10 mg by mouth as needed.     [provider]  glucose blood test strip USE 2 STRIPS TO TEST BLOOD SUGAR EVERY DAY AS DIRECTED 05/12/15   [provider]  hydrochlorothiazide (MICROZIDE) 12.5 MG capsule Take 12.5 mg by mouth daily. 03/13/21   [provider]  hydroxychloroquine (PLAQUENIL) 200 MG tablet TAKE 1 TABLET BY MONTH TWICE DAILY MONDAY THROUGH FRIDAY ONLY.  03/12/22   Ofilia Neas, PA-C  losartan (COZAAR) 25 MG tablet Take 25 mg by mouth daily. 03/13/21   [provider]  losartan-hydrochlorothiazide (HYZAAR) 50-12.5 MG tablet TK 1 T PO D Patient not taking: Reported on 05/02/2021 12/26/17   [provider]  metFORMIN (GLUCOPHAGE-XR) 500 MG 24 hr tablet TAKE 1 TABLET BY MOUTH EVERY NIGHT *SCHEDULE AN APPOINTMENT FOR ADDITIONAL REFILLS* 04/09/16   [provider]  Multiple Vitamin (MULTIVITAMIN PO) Take by mouth daily.    [provider]  OZEMPIC, 1 MG/DOSE, 4 MG/3ML SOPN SMARTSIG:0.75 Milliliter(s) SUB-Q Once a Week 09/04/21   [provider]  Semaglutide,0.25 or 0.5MG /DOS, 2 MG/1.5ML SOPN Inject into the skin. Patient not taking: Reported on 10/03/2021 08/06/19   [provider]  Turmeric 500 MG TABS Take 750 mg by mouth daily.    [provider]  UNABLE TO FIND Take by mouth. [employer], Historical, MD      Allergies    Lisinopril    Review of Systems   Review of Systems  Physical Exam Updated Vital Signs BP (!) 113/92   Pulse 90   Temp 98.3 F (36.8 C)   Resp 18   Ht 5\' 5"  (1.651 m)   Wt 86.2 kg   SpO2 100%   BMI 31.62 kg/m  Physical Exam Constitutional:      General: She is not in acute distress. HENT:  Head: Normocephalic and atraumatic.  Eyes:     Conjunctiva/sclera: Conjunctivae normal.     Pupils: Pupils are equal, round, and reactive to light.  Cardiovascular:     Rate and Rhythm: Normal rate and regular rhythm.  Pulmonary:     Effort: Pulmonary effort is normal. No respiratory distress.  Abdominal:     General: There is no distension.     Tenderness: There is no abdominal tenderness.  Skin:    General: Skin is warm and dry.  Neurological:     General: No focal deficit present.     Mental Status: She is alert and oriented to person, place, and time. Mental status is at baseline.     Cranial Nerves: No cranial nerve deficit.     Sensory:  No sensory deficit.     Motor: No weakness.  Psychiatric:        Mood and Affect: Mood normal.        Behavior: Behavior normal.     ED Results / Procedures / Treatments   Labs (all labs ordered are listed, but only abnormal results are displayed) Labs Reviewed  COMPREHENSIVE METABOLIC PANEL - Abnormal; Notable for the following components:      Result Value   BUN 22 (*)    Creatinine, Ser 1.26 (*)    Total Protein 8.6 (*)    GFR, Estimated 50 (*)    All other components within normal limits  CBC WITH DIFFERENTIAL/PLATELET - Abnormal; Notable for the following components:   Hemoglobin 10.2 (*)    HCT 34.7 (*)    MCV 76.6 (*)    MCH 22.5 (*)    MCHC 29.4 (*)    RDW 15.7 (*)    Lymphs Abs 0.6 (*)    All other components within normal limits  LIPASE, BLOOD - Abnormal; Notable for the following components:   Lipase 54 (*)    All other components within normal limits  RESP PANEL BY RT-PCR (RSV, FLU A&B, COVID)  [id number]  TROPONIN I (HIGH SENSITIVITY)    EKG EKG Interpretation  Date/Time:  Monday April 02 2022 10:34:36 EDT Ventricular Rate:  89 PR Interval:  157 QRS Duration: 90 QT Interval:  359 QTC Calculation: 437 R Axis:   67 Text Interpretation: Sinus rhythm Confirmed by Octaviano Glow [phone]) on 04/02/2022 11:04:42 AM  Radiology CT Head Wo Contrast  Result Date: 04/02/2022 CLINICAL DATA:  TIA symptoms, upper extremity paresthesias EXAM: CT HEAD WITHOUT CONTRAST TECHNIQUE: Contiguous axial images were obtained from the base of the skull through the vertex without intravenous contrast. RADIATION DOSE REDUCTION: This exam was performed according to the departmental dose-optimization program which includes automated exposure control, adjustment of the mA and/or kV according to patient size and/or use of iterative reconstruction technique. COMPARISON:  None Available. FINDINGS: Brain: No evidence of acute infarction, hemorrhage, hydrocephalus, extra-axial collection or mass  lesion/mass effect. Vascular: No hyperdense vessel or unexpected calcification. Skull: Normal. Negative for fracture or focal lesion. Sinuses/Orbits: No acute finding. Other: None. IMPRESSION: Normal head CT without contrast for age Electronically Signed   By: Jerilynn Mages.  Shick M.D.   On: 04/02/2022 11:55   DG Chest 2 View  Result Date: 04/02/2022 CLINICAL DATA:  left sided discomfort EXAM: CHEST - 2 VIEW COMPARISON:  07/16/2014 FINDINGS: Cardiac silhouette is unremarkable. No pneumothorax or pleural effusion. The lungs are clear. The visualized skeletal structures are unremarkable. IMPRESSION: No acute cardiopulmonary process. Electronically Signed   By: Sammie Bench M.D.   On: 04/02/2022  11:34    Procedures Procedures    Medications Ordered in ED Medications - No data to display  ED Course/ Medical Decision Making/ A&P                             Medical Decision Making Amount and/or Complexity of Data Reviewed Labs: ordered. Radiology: ordered.   This patient presents to the ED with concern for left arm paresthesias, nausea and vomiting. This involves an extensive number of treatment options, and is a complaint that carries with it a high risk of complications and morbidity.  The differential diagnosis includes viral illness versus atypical ACS versus peripheral paresthesia versus other  Co-morbidities that complicate the patient evaluation: Cardiovascular risk factors including hypertension hyperlipidemia  While this presentation appears much less consistent with a stroke, especially given that has been intermittent for several months, given that she has persistent symptoms at the moment and she has never had neuroimaging, I think a CT scan of the head would be reasonable.  I ordered and personally interpreted labs.  The pertinent results include: No emergent findings  I ordered imaging studies including CT scan of the head, x-ray of the chest I independently visualized and  interpreted imaging which showed no emergent findings I agree with the radiologist interpretation  The patient was maintained on a cardiac monitor.  I personally viewed and interpreted the cardiac monitored which showed an underlying rhythm of: Normal sinus rhythm  Per my interpretation the patient's ECG shows no acute ischemic findings  I have reviewed the patients home medicines and have made adjustments as needed  Test Considered: I have a lower suspicion for acute PE clinically do not see an indication for CT angiogram.  Low suspicion for [facility] or MS denies indication for lumbar puncture or MRI at this time.   After the interventions noted above, I reevaluated the patient and found that they have: improved  Clinically I suspect that her symptoms of left arm paresthesias are most likely related to peripheral neuropathy given that they have been waxing and waning for many months.  Dispostion:  After consideration of the diagnostic results and the patients response to treatment, I feel that the patent would benefit from follow-up with her PCP.         Final Clinical Impression(s) / ED Diagnoses Final diagnoses:  Paresthesia of left arm    Rx / DC Orders ED Discharge Orders     None         Wyvonnia Dusky, MD 04/02/22 1325

## 2022-04-02 NOTE — ED Triage Notes (Signed)
Pt arrives to ER with reports of left arm and hand tingling. Denies any sob, difficulty breathing. Pt states the tingling began around 8 am this morning. States the tingling comes and goes. Denies any blurred vision.

## 2022-04-02 NOTE — ED Notes (Signed)
Discharge paperwork reviewed entirely with patient, including Rx's and follow up care. Pain was under control. Pt verbalized understanding as well as all parties involved. No questions or concerns voiced at the time of discharge. No acute distress noted.   Pt ambulated out to PVA without incident or assistance.  

## 2022-04-09 DIAGNOSIS — Z9189 Other specified personal risk factors, not elsewhere classified: Secondary | ICD-10-CM | POA: Diagnosis not present

## 2022-04-09 DIAGNOSIS — Z1239 Encounter for other screening for malignant neoplasm of breast: Secondary | ICD-10-CM | POA: Diagnosis not present

## 2022-04-16 DIAGNOSIS — E042 Nontoxic multinodular goiter: Secondary | ICD-10-CM | POA: Diagnosis not present

## 2022-05-01 ENCOUNTER — Other Ambulatory Visit: Payer: Self-pay | Admitting: Physician Assistant

## 2022-05-01 DIAGNOSIS — M3219 Other organ or system involvement in systemic lupus erythematosus: Secondary | ICD-10-CM

## 2022-05-08 DIAGNOSIS — Z7984 Long term (current) use of oral hypoglycemic drugs: Secondary | ICD-10-CM | POA: Diagnosis not present

## 2022-05-08 DIAGNOSIS — E119 Type 2 diabetes mellitus without complications: Secondary | ICD-10-CM | POA: Diagnosis not present

## 2022-05-08 DIAGNOSIS — Z1239 Encounter for other screening for malignant neoplasm of breast: Secondary | ICD-10-CM | POA: Diagnosis not present

## 2022-05-08 DIAGNOSIS — Z79899 Other long term (current) drug therapy: Secondary | ICD-10-CM | POA: Diagnosis not present

## 2022-05-08 DIAGNOSIS — Z9189 Other specified personal risk factors, not elsewhere classified: Secondary | ICD-10-CM | POA: Diagnosis not present

## 2022-05-08 DIAGNOSIS — M328 Other forms of systemic lupus erythematosus: Secondary | ICD-10-CM | POA: Diagnosis not present

## 2022-05-31 DIAGNOSIS — N951 Menopausal and female climacteric states: Secondary | ICD-10-CM | POA: Diagnosis not present

## 2022-05-31 DIAGNOSIS — F411 Generalized anxiety disorder: Secondary | ICD-10-CM | POA: Diagnosis not present

## 2022-06-13 ENCOUNTER — Telehealth: Payer: Self-pay

## 2022-06-13 NOTE — Telephone Encounter (Signed)
Patient contacted the office and left a message to Sue Lush to fill out the patient's work from home paperwork because it needs to be renewed. Contacted the patient to have her send the paperwork to the office so it can be worked on. Patient states she will send the paperwork to [facility].

## 2022-06-22 ENCOUNTER — Other Ambulatory Visit: Payer: Self-pay | Admitting: Physician Assistant

## 2022-06-22 DIAGNOSIS — M3219 Other organ or system involvement in systemic lupus erythematosus: Secondary | ICD-10-CM

## 2022-06-22 NOTE — Telephone Encounter (Signed)
Last Fill: 03/12/2022  Eye exam: 05/01/2021 WNL   Labs: 03/08/2022 Creat. 1.23, GFR 52, Total Protein, Globulin 4.2, WBC 3.1, Hgb 10.6, Hct 34.6, MCV 74.6, MCH 22.8, MCHC 30.6, Absolute Monocytes 198   Next Visit: 08/06/2022  Last Visit: 03/08/2022  DX:Other organ or system involvement in systemic lupus erythematosus   Current Dose per office note 03/09/2022:  Plaquenil 200 mg 1 tablet by mouth twice daily Monday through Friday only   Spoke with patient and she states that she has updated her PLQ eye exam. Patient states she will fax results to our office.   Okay to refill Plaquenil?

## 2022-06-22 NOTE — Telephone Encounter (Signed)
Patient cannot get fax to go through for PLQ eye exam form. I confirmed the fax number and it was correct. I advised patient to upload a picture of the form to her MyChart account. Patient verbalized understanding.

## 2022-07-11 DIAGNOSIS — Z01419 Encounter for gynecological examination (general) (routine) without abnormal findings: Secondary | ICD-10-CM | POA: Diagnosis not present

## 2022-07-24 NOTE — Progress Notes (Signed)
Office Visit Note  Patient: Stephanie Myers             Date of Birth: 04/23/1965           MRN: [id number]             PCP: Loyal Jacobson, MD Referring: Loyal Jacobson, MD Visit Date: 08/07/2022 Occupation: @GUAROCC @  Subjective:  Medication monitoring  History of Present Illness: Stephanie Myers is a 57 y.o. female with history of systemic lupus and osteoarthritis. Patient remains on Plaquenil 200 mg 1 tablet by mouth twice daily Monday through Friday only.  She is tolerating Plaquenil without any side effects and has not missed any doses recently.  She denies any signs or symptoms of a systemic lupus flare.  Patient denies any sores in her mouth or nose recently.  She denies any sicca symptoms.  She has not noticed any swollen lymph nodes.  She has not had any recent rashes or symptoms of Raynaud's phenomenon.  Her energy level has been stable.  Patient states she continues to have discomfort in both hips secondary to trochanteric bursitis bilaterally.  Her discomfort is exacerbated by lying on her sides at night.  She denies any other joint pain or joint swelling at this time.  She denies any recurrent infections.  Patient states she has been sleeping well overall.  She denies any other new medical conditions.  Activities of Daily Living:  Patient reports morning stiffness for a few minutes.   Patient Reports nocturnal pain.  Difficulty dressing/grooming: Denies Difficulty climbing stairs: Denies Difficulty getting out of chair: Denies Difficulty using hands for taps, buttons, cutlery, and/or writing: Denies  Review of Systems  Constitutional:  Positive for fatigue.  HENT:  Negative for mouth sores and mouth dryness.   Eyes:  Negative for dryness.  Respiratory:  Negative for shortness of breath.   Cardiovascular:  Negative for chest pain and palpitations.  Gastrointestinal:  Negative for blood in stool, constipation and diarrhea.  Endocrine: Negative for increased urination.   Genitourinary:  Negative for involuntary urination.  Musculoskeletal:  Positive for morning stiffness. Negative for joint pain, gait problem, joint pain, joint swelling, myalgias, muscle weakness, muscle tenderness and myalgias.  Skin:  Positive for sensitivity to sunlight. Negative for color change, rash and hair loss.  Allergic/Immunologic: Negative for susceptible to infections.  Neurological:  Negative for dizziness and headaches.  Hematological:  Negative for swollen glands.  Psychiatric/Behavioral:  Negative for depressed mood and sleep disturbance. The patient is nervous/anxious.     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   Osteoarthritis of foot 12/20/2015   Bilateral primary osteoarthritis of knee 12/20/2015    Past Medical History:  Diagnosis Date   Anemia    Diabetes mellitus without complication (HCC)    Hyperlipidemia    Hypertension    Lupus (HCC)    Shingles    Systemic lupus erythematosus (HCC)     Family History  Problem Relation Age of Onset   Healthy Daughter    Healthy Son    Past Surgical History:  Procedure Laterality Date   ABDOMINAL HYSTERECTOMY     CESAREAN SECTION     Social History   Social History Narrative   Not on file   Immunization History  Administered Date(s) Administered  Hepatitis B 12/05/2016, 06/04/2017, 12/26/2017   Influenza,inj,Quad PF,6+ Mos 10/07/2015, 12/05/2016, 12/26/2017   PFIZER Comirnaty(Gray Top)Covid-19 Tri-Sucrose Vaccine 04/05/2019, 04/26/2019   Pneumococcal Polysaccharide-23 03/05/2011   Tdap 03/05/2011     Objective: Vital Signs: BP 114/77 (BP Location: Left Arm, Patient Position: Sitting, Cuff Size: Normal)   Pulse 91   Resp 15   Ht 5\' 4"  (1.626 m)   Wt 180 lb (81.6 kg)   BMI 30.90  kg/m    Physical Exam Vitals and nursing note reviewed.  Constitutional:      Appearance: She is well-developed.  HENT:     Head: Normocephalic and atraumatic.     Mouth/Throat:     Comments: No parotid swelling or tenderness. Eyes:     Conjunctiva/sclera: Conjunctivae normal.  Cardiovascular:     Rate and Rhythm: Normal rate and regular rhythm.     Heart sounds: Normal heart sounds.  Pulmonary:     Effort: Pulmonary effort is normal.     Breath sounds: Normal breath sounds.  Abdominal:     General: Bowel sounds are normal.     Palpations: Abdomen is soft.  Musculoskeletal:     Cervical back: Normal range of motion.  Lymphadenopathy:     Cervical: No cervical adenopathy.  Skin:    General: Skin is warm and dry.     Capillary Refill: Capillary refill takes less than 2 seconds.     Comments: No malar rash   Neurological:     Mental Status: She is alert and oriented to person, place, and time.  Psychiatric:        Behavior: Behavior normal.      Musculoskeletal Exam: C-spine, thoracic spine, lumbar spine have good range of motion.  No midline spinal tenderness.  Shoulder joints, elbow joints, wrist joints, MCPs, PIPs, DIPs have good range of motion with no synovitis.  Complete fist formation bilaterally.  Hip joints have good range of motion with no groin pain.  Knee joints have good range of motion with no warmth or effusion.  Ankle joints have good range of motion with no tenderness or joint swelling.  No tenderness or synovitis over MTP joints.  CDAI Exam: CDAI Score: -- Patient Global: --; Provider Global: -- Swollen: --; Tender: -- Joint Exam 08/07/2022   No joint exam has been documented for this visit   There is currently no information documented on the homunculus. Go to the Rheumatology activity and complete the homunculus joint exam.  Investigation: No additional findings.  Imaging: No results found.  Recent Labs: Lab Results  Component Value Date    WBC 4.4 04/02/2022   HGB 10.2 (L) 04/02/2022   PLT 247 04/02/2022   NA 135 04/02/2022   K 4.8 04/02/2022   CL 100 04/02/2022   CO2 26 04/02/2022   GLUCOSE 83 04/02/2022   BUN 22 (H) 04/02/2022   CREATININE 1.26 (H) 04/02/2022   BILITOT 0.3 04/02/2022   ALKPHOS 78 04/02/2022   AST 26 04/02/2022   ALT 21 04/02/2022   PROT 8.6 (H) 04/02/2022   ALBUMIN 4.2 04/02/2022   CALCIUM 9.6 04/02/2022   GFRAA 65 07/05/2020    Speciality Comments: PLQ Eye Exam: 04/18/2022 WNL @ [facility] follow up in 12 months  (Can also view in Care Everywhere)  Procedures:  No procedures performed Allergies: Lisinopril   Assessment / Plan:     Visit Diagnoses: Other organ or system involvement in systemic lupus erythematosus (HCC) - +ANA, +ds DNA, + Smith, RNP and Ro:  She has not had any signs or symptoms of a systemic lupus flare.  She has clinically been doing well taking Plaquenil 200 mg 1 tablet by mouth twice daily Monday through Friday.  She is tolerating Plaquenil without any side effects and has not missed any doses recently.  She has no synovitis on examination today.  Her energy level has been stable.  No cervical lymphadenopathy was noted.  No parotid swelling or tenderness.  She has not been experiencing any sicca symptoms.  No oral or nasal ulcerations.  She has not had any recent rashes.  Discussed the importance of avoiding direct sun exposure and wearing sunscreen SPF 50 or greater on a daily basis.  She has not had any symptoms of Raynaud's phenomenon.  Good capillary refill noted on examination today.  No digital ulcerations noted.  She remains on aspirin 81 mg daily. Lab work from 03/08/22 was reviewed today in the office: dsDNA 24, ESR WNL, and complements WNL.  The following lab work will be obtained today for further evaluation.  She will remain on Plaquenil as prescribed.  She was advised to notify us if she develops signs or symptoms of a flare.  She will follow-up in the office in 5 months  or sooner if needed.  - Plan: CBC with Differential/Platelet, COMPLETE METABOLIC PANEL WITH GFR, Protein / creatinine ratio, urine, Anti-DNA antibody, double-stranded, C3 and C4, Sedimentation rate  High risk medication use - Plaquenil 200 mg 1 tablet by mouth twice daily Monday through Friday only.  PLQ Eye Exam: 04/18/2022 WNL @ [facility] follow up in 12 months  CBC and CMP updated on 04/02/22.  Orders for CBC and CMP released today.  - Plan: CBC with Differential/Platelet, COMPLETE METABOLIC PANEL WITH GFR  Primary osteoarthritis of both hands: No tenderness or synovitis noted today.  Complete fist formation bilaterally.  Discussed the importance of joint protection and muscle strengthening.  Trochanteric bursitis of both hips: Patient has ongoing tenderness over bilateral trochanteric bursa, left greater than right.  Good range of motion of both hip joints with no groin pain.  Discussed the importance of performing stretching exercises on a daily basis.  Patient was advised to notify us if her symptoms persist or worsen.  Bilateral primary osteoarthritis of knee: Good range of motion of both knee joints on examination today.  No warmth or effusion noted.  Primary osteoarthritis of both feet: She is not experiencing any increased discomfort in her feet at this time.  She is good range of motion of both ankle joints with no tenderness or synovitis.  No tenderness or synovitis over MTP joints.  Lumbar facet arthropathy: No midline spinal tenderness at this time.  No symptoms of radiculopathy.  Essential hypertension - Followed Dr. Glenna Fellows in nephrology.  Blood pressure was 114/77 today in the office.  Orders: Orders Placed This Encounter  Procedures   CBC with Differential/Platelet   COMPLETE METABOLIC PANEL WITH GFR   Protein / creatinine ratio, urine   Anti-DNA antibody, double-stranded   C3 and C4   Sedimentation rate   No orders of the defined types were placed in this  encounter.   Follow-Up Instructions: Return in about 5 months (around 01/07/2023) for Systemic lupus erythematosus, Osteoarthritis.   Gearldine Bienenstock, PA-C  Note - This record has been created using Dragon software.  Chart creation errors have been sought, but may not always  have been located. Such creation errors do not reflect on  the standard of medical care.

## 2022-08-06 ENCOUNTER — Ambulatory Visit: Payer: BC Managed Care – PPO | Admitting: Physician Assistant

## 2022-08-07 ENCOUNTER — Ambulatory Visit: Payer: BC Managed Care – PPO | Attending: Physician Assistant | Admitting: Physician Assistant

## 2022-08-07 ENCOUNTER — Encounter: Payer: Self-pay | Admitting: Physician Assistant

## 2022-08-07 VITALS — BP 114/77 | HR 91 | Resp 15 | Ht 64.0 in | Wt 180.0 lb

## 2022-08-07 DIAGNOSIS — M19041 Primary osteoarthritis, right hand: Secondary | ICD-10-CM

## 2022-08-07 DIAGNOSIS — M7061 Trochanteric bursitis, right hip: Secondary | ICD-10-CM

## 2022-08-07 DIAGNOSIS — Z79899 Other long term (current) drug therapy: Secondary | ICD-10-CM | POA: Diagnosis not present

## 2022-08-07 DIAGNOSIS — I10 Essential (primary) hypertension: Secondary | ICD-10-CM

## 2022-08-07 DIAGNOSIS — M3219 Other organ or system involvement in systemic lupus erythematosus: Secondary | ICD-10-CM

## 2022-08-07 DIAGNOSIS — M19072 Primary osteoarthritis, left ankle and foot: Secondary | ICD-10-CM

## 2022-08-07 DIAGNOSIS — M7062 Trochanteric bursitis, left hip: Secondary | ICD-10-CM

## 2022-08-07 DIAGNOSIS — M19071 Primary osteoarthritis, right ankle and foot: Secondary | ICD-10-CM

## 2022-08-07 DIAGNOSIS — M19042 Primary osteoarthritis, left hand: Secondary | ICD-10-CM

## 2022-08-07 DIAGNOSIS — M47816 Spondylosis without myelopathy or radiculopathy, lumbar region: Secondary | ICD-10-CM

## 2022-08-07 DIAGNOSIS — M170 Bilateral primary osteoarthritis of knee: Secondary | ICD-10-CM

## 2022-08-07 LAB — CBC WITH DIFFERENTIAL/PLATELET
Basophils Absolute: 30 cells/uL (ref 0–200)
Eosinophils Absolute: 50 cells/uL (ref 15–500)
HCT: 32.9 % — ABNORMAL LOW (ref 35.0–45.0)
Hemoglobin: 9.9 g/dL — ABNORMAL LOW (ref 11.7–15.5)
Lymphs Abs: 673 cells/uL — ABNORMAL LOW (ref 850–3900)
MCHC: 30.1 g/dL — ABNORMAL LOW (ref 32.0–36.0)
MCV: 74.9 fL — ABNORMAL LOW (ref 80.0–100.0)
Monocytes Relative: 6.9 %
RBC: 4.39 10*6/uL (ref 3.80–5.10)
RDW: 15.1 % — ABNORMAL HIGH (ref 11.0–15.0)
Total Lymphocyte: 26.9 %
WBC: 2.5 10*3/uL — ABNORMAL LOW (ref 3.8–10.8)

## 2022-08-08 LAB — COMPLETE METABOLIC PANEL WITH GFR
AG Ratio: 1.2 (calc) (ref 1.0–2.5)
ALT: 28 U/L (ref 6–29)
AST: 26 U/L (ref 10–35)
Albumin: 4.6 g/dL (ref 3.6–5.1)
Alkaline phosphatase (APISO): 66 U/L (ref 37–153)
BUN/Creatinine Ratio: 19 (calc) (ref 6–22)
BUN: 25 mg/dL (ref 7–25)
CO2: 26 mmol/L (ref 20–32)
Calcium: 10.1 mg/dL (ref 8.6–10.4)
Chloride: 100 mmol/L (ref 98–110)
Creat: 1.29 mg/dL — ABNORMAL HIGH (ref 0.50–1.03)
Globulin: 3.9 g/dL (calc) — ABNORMAL HIGH (ref 1.9–3.7)
Glucose, Bld: 85 mg/dL (ref 65–99)
Potassium: 3.8 mmol/L (ref 3.5–5.3)
Sodium: 135 mmol/L (ref 135–146)
Total Bilirubin: 0.3 mg/dL (ref 0.2–1.2)
Total Protein: 8.5 g/dL — ABNORMAL HIGH (ref 6.1–8.1)
eGFR: 49 mL/min/{1.73_m2} — ABNORMAL LOW (ref >=60)

## 2022-08-08 LAB — SEDIMENTATION RATE: Sed Rate: 17 mm/h (ref 0–30)

## 2022-08-08 LAB — PROTEIN / CREATININE RATIO, URINE
Creatinine, Urine: 42 mg/dL (ref 20–275)
Total Protein, Urine: <4 mg/dL — ABNORMAL LOW (ref 5–24)

## 2022-08-08 LAB — ANTI-DNA ANTIBODY, DOUBLE-STRANDED: ds DNA Ab: 31 IU/mL — ABNORMAL HIGH

## 2022-08-08 LAB — CBC WITH DIFFERENTIAL/PLATELET
Absolute Monocytes: 173 cells/uL — ABNORMAL LOW (ref 200–950)
Basophils Relative: 1.2 %
Eosinophils Relative: 2 %
MCH: 22.6 pg — ABNORMAL LOW (ref 27.0–33.0)
MPV: 11.4 fL (ref 7.5–12.5)
Neutro Abs: 1575 cells/uL (ref 1500–7800)
Neutrophils Relative %: 63 %
Platelets: 222 10*3/uL (ref 140–400)

## 2022-08-08 LAB — C3 AND C4
C3 Complement: 103 mg/dL (ref 83–193)
C4 Complement: 33 mg/dL (ref 15–57)

## 2022-08-10 ENCOUNTER — Telehealth: Payer: Self-pay | Admitting: *Deleted

## 2022-08-10 DIAGNOSIS — Z79899 Other long term (current) drug therapy: Secondary | ICD-10-CM

## 2022-08-10 NOTE — Progress Notes (Signed)
WBC count is low-2.5. absolute lymphocytes and monocytes are low.   Anemia has worsened.  Creatinine remains elevated and GFR is low-49.  No proteinuria.  Complements WNL ESR Wnl dsDNA remains positive and is trending up-31.  Reviewed lab work with Dr. Corliss Skains.  Please make sure the patient is taking plaquenil as prescribed.  Repeat CBC with diff in 1 month.

## 2022-08-10 NOTE — Telephone Encounter (Signed)
-----   Message from Gearldine Bienenstock sent at 08/10/2022  8:02 AM EDT ----- WBC count is low-2.5. absolute lymphocytes and monocytes are low.   Anemia has worsened.  Creatinine remains elevated and GFR is low-49.  No proteinuria.  Complements WNL ESR Wnl dsDNA remains positive and is trending up-31.  Reviewed lab work with Dr. Corliss Skains.  Please make sure the patient is taking plaquenil as prescribed.  Repeat CBC with diff in 1 month.

## 2022-08-21 ENCOUNTER — Other Ambulatory Visit: Payer: Self-pay | Admitting: *Deleted

## 2022-08-21 DIAGNOSIS — M3219 Other organ or system involvement in systemic lupus erythematosus: Secondary | ICD-10-CM

## 2022-08-21 MED ORDER — HYDROXYCHLOROQUINE SULFATE 200 MG PO TABS: ORAL_TABLET | ORAL | 2 refills | Status: DC

## 2022-08-21 NOTE — Telephone Encounter (Signed)
Refill request received via fax from [facility], [place]  for Plaquenil   Last Fill: 06/22/2022  Eye exam: 04/18/2022 WNL    Labs: 08/07/2022 WBC count is low-2.5. absolute lymphocytes and monocytes are low.   Anemia has worsened. Creatinine remains elevated and GFR is low-49.  Next Visit: 01/07/2023  Last Visit: 08/07/2022  DX: Other organ or system involvement in systemic lupus erythematosus   Current Dose per office note 08/07/2022: Plaquenil 200 mg 1 tablet by mouth twice daily Monday through Friday only.   Okay to refill Plaquenil?

## 2022-08-22 ENCOUNTER — Other Ambulatory Visit: Payer: Self-pay | Admitting: Physician Assistant

## 2022-08-22 DIAGNOSIS — M3219 Other organ or system involvement in systemic lupus erythematosus: Secondary | ICD-10-CM

## 2022-09-12 ENCOUNTER — Other Ambulatory Visit: Payer: Self-pay

## 2022-09-12 DIAGNOSIS — Z1231 Encounter for screening mammogram for malignant neoplasm of breast: Secondary | ICD-10-CM | POA: Diagnosis not present

## 2022-09-12 DIAGNOSIS — Z9189 Other specified personal risk factors, not elsewhere classified: Secondary | ICD-10-CM | POA: Diagnosis not present

## 2022-09-12 DIAGNOSIS — Z1239 Encounter for other screening for malignant neoplasm of breast: Secondary | ICD-10-CM | POA: Diagnosis not present

## 2022-09-12 DIAGNOSIS — Z79899 Other long term (current) drug therapy: Secondary | ICD-10-CM

## 2022-09-12 LAB — CBC WITH DIFFERENTIAL/PLATELET
Absolute Monocytes: 228 cells/uL (ref 200–950)
Basophils Absolute: 29 cells/uL (ref 0–200)
Basophils Relative: 1.2 %
Eosinophils Absolute: 70 cells/uL (ref 15–500)
Eosinophils Relative: 2.9 %
HCT: 32.4 % — ABNORMAL LOW (ref 35.0–45.0)
Hemoglobin: 9.7 g/dL — ABNORMAL LOW (ref 11.7–15.5)
Lymphs Abs: 646 cells/uL — ABNORMAL LOW (ref 850–3900)
MCH: 22.7 pg — ABNORMAL LOW (ref 27.0–33.0)
MCHC: 29.9 g/dL — ABNORMAL LOW (ref 32.0–36.0)
MCV: 75.9 fL — ABNORMAL LOW (ref 80.0–100.0)
MPV: 11 fL (ref 7.5–12.5)
Monocytes Relative: 9.5 %
Neutro Abs: 1428 cells/uL — ABNORMAL LOW (ref 1500–7800)
Neutrophils Relative %: 59.5 %
Platelets: 223 10*3/uL (ref 140–400)
RBC: 4.27 10*6/uL (ref 3.80–5.10)
RDW: 14.8 % (ref 11.0–15.0)
Total Lymphocyte: 26.9 %
WBC: 2.4 10*3/uL — ABNORMAL LOW (ref 3.8–10.8)

## 2022-09-13 ENCOUNTER — Other Ambulatory Visit: Payer: Self-pay | Admitting: *Deleted

## 2022-09-13 DIAGNOSIS — Z79899 Other long term (current) drug therapy: Secondary | ICD-10-CM

## 2022-09-13 MED ORDER — HYDROXYCHLOROQUINE SULFATE 200 MG PO TABS: 200.0000 mg | ORAL_TABLET | Freq: Two times a day (BID) | ORAL | 2 refills | Status: DC

## 2022-09-13 NOTE — Progress Notes (Signed)
Recheck CBC with diff and CMP with GFR in 1 month after increasing the dose of plaquenil.

## 2022-09-13 NOTE — Telephone Encounter (Signed)
-----   Message from Gearldine Bienenstock sent at 09/13/2022  9:03 AM EDT ----- WBC count remains low-2.4.  absolute neutrophils and absolute lymphocytes are low.  Anemia continues to worsen.   Please clarify if she has had an anemia workup? Reviewed lab work with Dr. Marcello Moores increasing plaquenil to 200 mg 1 tablet by mouth twice daily.

## 2022-09-13 NOTE — Progress Notes (Signed)
WBC count remains low-2.4.  absolute neutrophils and absolute lymphocytes are low.  Anemia continues to worsen.   Please clarify if she has had an anemia workup? Reviewed lab work with Dr. Marcello Moores increasing plaquenil to 200 mg 1 tablet by mouth twice daily.

## 2022-09-13 NOTE — Telephone Encounter (Signed)
-----   Message from Gearldine Bienenstock sent at 09/13/2022 10:29 AM EDT ----- Recheck CBC with diff and CMP with GFR in 1 month after increasing the dose of plaquenil.

## 2022-11-12 DIAGNOSIS — F419 Anxiety disorder, unspecified: Secondary | ICD-10-CM | POA: Diagnosis not present

## 2022-11-12 DIAGNOSIS — R0989 Other specified symptoms and signs involving the circulatory and respiratory systems: Secondary | ICD-10-CM | POA: Diagnosis not present

## 2022-11-12 DIAGNOSIS — R072 Precordial pain: Secondary | ICD-10-CM | POA: Diagnosis not present

## 2022-11-12 DIAGNOSIS — R079 Chest pain, unspecified: Secondary | ICD-10-CM | POA: Diagnosis not present

## 2022-11-13 ENCOUNTER — Telehealth: Payer: Self-pay | Admitting: *Deleted

## 2022-11-13 NOTE — Telephone Encounter (Signed)
Patient called, please review labs from 11/12/2022 and advise if any changes are needed. Thank you.

## 2022-11-13 NOTE — Telephone Encounter (Signed)
White cell count is low and stable.  Hemoglobin is better.  Creatinine is high and stable.  Patient should discuss elevated creatinine with her nephrologist.  We will do autoimmune labs with next visit.  No change in treatment advised.

## 2022-11-14 ENCOUNTER — Encounter: Payer: Self-pay | Admitting: *Deleted

## 2022-11-14 NOTE — Telephone Encounter (Signed)
Patient advised White cell count is low and stable. Hemoglobin is better. Creatinine is high and stable. Patient should discuss elevated creatinine with her nephrologist. We will do autoimmune labs with next visit. No change in treatment advised.

## 2022-12-06 DIAGNOSIS — E1129 Type 2 diabetes mellitus with other diabetic kidney complication: Secondary | ICD-10-CM | POA: Diagnosis not present

## 2022-12-06 DIAGNOSIS — Z23 Encounter for immunization: Secondary | ICD-10-CM | POA: Diagnosis not present

## 2022-12-06 DIAGNOSIS — E785 Hyperlipidemia, unspecified: Secondary | ICD-10-CM | POA: Diagnosis not present

## 2022-12-06 DIAGNOSIS — F411 Generalized anxiety disorder: Secondary | ICD-10-CM | POA: Diagnosis not present

## 2022-12-06 DIAGNOSIS — R809 Proteinuria, unspecified: Secondary | ICD-10-CM | POA: Diagnosis not present

## 2022-12-11 DIAGNOSIS — R079 Chest pain, unspecified: Secondary | ICD-10-CM | POA: Diagnosis not present

## 2022-12-20 ENCOUNTER — Other Ambulatory Visit: Payer: Self-pay | Admitting: Physician Assistant

## 2022-12-20 NOTE — Telephone Encounter (Signed)
Last Fill: 09/13/2022  Eye exam: 04/18/2022 WNL    Labs: 12/06/2022 Sodium 134, BUN 26, Creat. 1.55, GFR 39, 11/12/2022 WBC 2.09, Hgb 10.6, Hct 33.4, MCV 73.7, MCH 23.4, MCHC 31.8, Neutrophils Absolute 1.30, Lymphocytes # 0.60  Next Visit: 01/07/2023  Last Visit: 08/07/2022  DX:Other organ or system involvement in systemic lupus erythematosus   Current Dose per lab note 09/13/2022: recommend increasing plaquenil to 200 mg 1 tablet by mouth twice daily.   Okay to refill Plaquenil?

## 2022-12-24 NOTE — Progress Notes (Unsigned)
Office Visit Note  Patient: Stephanie Myers             Date of Birth: July 24, 1965           MRN: [id number]             PCP: Loyal Jacobson, MD Referring: Loyal Jacobson, MD Visit Date: 01/07/2023 Occupation: @GUAROCC @  Subjective:  Medication monitoring  History of Present Illness: Stephanie Myers is a 57 y.o. female with history of systemic lupus and osteoarthritis.  Patient remains on Plaquenil 200 mg 1 tablet by mouth twice daily.  She continues to tolerate Plaquenil without any side effects and has not had any recent gaps in therapy.  She denies any signs or symptoms of a systemic lupus flare.  She denies any joint pain, joint swelling, or joint stiffness.  She denies any recent rashes or increased photosensitivity.  She has been avoiding direct sun exposure as advised.  She denies any symptoms of Raynaud's phenomenon but states that her fingertips and toes stay cold.  She denies any oral or nasal ulcerations.  She has not had any sicca symptoms.  She denies any swollen lymph nodes.  She denies any shortness of breath or coughing. She denies any new medical conditions.  She denies any recent or recurrent infections.     Activities of Daily Living:  Patient reports morning stiffness for 2-3 minutes.   Patient Reports nocturnal pain.  Difficulty dressing/grooming: Denies Difficulty climbing stairs: Denies Difficulty getting out of chair: Denies Difficulty using hands for taps, buttons, cutlery, and/or writing: Denies  Review of Systems  Constitutional:  Negative for fatigue.  HENT:  Negative for mouth sores and mouth dryness.   Eyes:  Negative for dryness.  Respiratory:  Negative for shortness of breath.   Cardiovascular:  Negative for chest pain and palpitations.  Gastrointestinal:  Negative for blood in stool, constipation and diarrhea.  Endocrine: Negative for increased urination.  Genitourinary:  Negative for involuntary urination.  Musculoskeletal:  Positive for joint  pain, gait problem, joint pain, joint swelling and morning stiffness. Negative for myalgias, muscle weakness, muscle tenderness and myalgias.  Skin:  Negative for color change, rash, hair loss and sensitivity to sunlight.  Allergic/Immunologic: Negative for susceptible to infections.  Neurological:  Negative for dizziness and headaches.  Hematological:  Negative for swollen glands.  Psychiatric/Behavioral:  Negative for depressed mood and sleep disturbance. The patient is not nervous/anxious.     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   Osteoarthritis of foot 12/20/2015   Bilateral primary osteoarthritis of knee 12/20/2015    Past Medical History:  Diagnosis Date   Alpha thalassemia trait    Anemia    Diabetes mellitus without complication (HCC)    Hyperlipidemia    Hypertension    Lupus    Shingles    Systemic lupus erythematosus (HCC)     Family History  Problem Relation Age of Onset   Healthy Daughter    Healthy Son    Past Surgical History:  Procedure Laterality Date   ABDOMINAL HYSTERECTOMY     CESAREAN SECTION     Social History   Social History Narrative   Not on file   Immunization History  Administered Date(s) Administered   Hepatitis B 12/05/2016, 06/04/2017, 12/26/2017   Influenza,inj,Quad  PF,6+ Mos 10/07/2015, 12/05/2016, 12/26/2017   PFIZER Comirnaty(Gray Top)Covid-19 Tri-Sucrose Vaccine 04/05/2019, 04/26/2019   Pneumococcal Polysaccharide-23 03/05/2011   Tdap 03/05/2011     Objective: Vital Signs: BP 106/74 (BP Location: Left Arm, Patient Position: Sitting, Cuff Size: Normal)   Pulse 90   Ht 5\' 5"  (1.651 m)   Wt 174 lb (78.9 kg)   BMI 28.96 kg/m    Physical Exam Vitals and nursing note reviewed.  Constitutional:       Appearance: She is well-developed.  HENT:     Head: Normocephalic and atraumatic.  Eyes:     Conjunctiva/sclera: Conjunctivae normal.  Cardiovascular:     Rate and Rhythm: Normal rate and regular rhythm.     Heart sounds: Normal heart sounds.  Pulmonary:     Effort: Pulmonary effort is normal.     Breath sounds: Normal breath sounds.  Abdominal:     General: Bowel sounds are normal.     Palpations: Abdomen is soft.  Musculoskeletal:     Cervical back: Normal range of motion.  Lymphadenopathy:     Cervical: No cervical adenopathy.  Skin:    General: Skin is warm and dry.     Capillary Refill: Capillary refill takes less than 2 seconds.  Neurological:     Mental Status: She is alert and oriented to person, place, and time.  Psychiatric:        Behavior: Behavior normal.      Musculoskeletal Exam: C-spine, thoracic spine, lumbar spine have good range of motion.  Shoulder joints, elbow joints, wrist joints, MCPs, PIPs, DIPs have good range of motion with no synovitis.  Complete fist formation bilaterally.  Hip joints have good range of motion with no groin pain.  Knee joints have good range of motion no warmth or effusion.  Ankle joints have good range of motion with no tenderness or joint swelling.  CDAI Exam: CDAI Score: -- Patient Global: --; Provider Global: -- Swollen: --; Tender: -- Joint Exam 01/07/2023   No joint exam has been documented for this visit   There is currently no information documented on the homunculus. Go to the Rheumatology activity and complete the homunculus joint exam.  Investigation: No additional findings.  Imaging: No results found.  Recent Labs: Lab Results  Component Value Date   WBC 2.4 (L) 09/12/2022   HGB 9.7 (L) 09/12/2022   PLT 223 09/12/2022   NA 135 08/07/2022   K 3.8 08/07/2022   CL 100 08/07/2022   CO2 26 08/07/2022   GLUCOSE 85 08/07/2022   BUN 25 08/07/2022   CREATININE 1.29 (H) 08/07/2022   BILITOT 0.3 08/07/2022    ALKPHOS 78 04/02/2022   AST 26 08/07/2022   ALT 28 08/07/2022   PROT 8.5 (H) 08/07/2022   ALBUMIN 4.2 04/02/2022   CALCIUM 10.1 08/07/2022   GFRAA 65 07/05/2020    Speciality Comments: PLQ Eye Exam: 04/18/2022 WNL @ [facility] follow up in 12 months  (Can also view in Care Everywhere)  Procedures:  No procedures performed Allergies: Lisinopril  PLQ Eye Exam: 04/18/2022 WNL @ [facility] follow up in 12 months     Assessment / Plan:     Visit Diagnoses: Other organ or system involvement in systemic lupus erythematosus (HCC) - +ANA, +ds DNA, + Smith, RNP and Ro: She has not had any signs or symptoms of a systemic lupus flare.  The dose of Plaquenil was increased from 200 mg 1 tablet twice daily Monday through Friday to 1 tablet  twice daily every day after her last lab work.  She has been tolerating the increased dose of Plaquenil without any side effects and has not had any recent gaps in therapy.  She has no synovitis on examination today.  No oral or nasal ulcerations.  No parotid swelling or tenderness noted.  No sicca symptoms.  No symptoms of Raynaud's phenomenon.  Fingertips were cool to the touch but had good capillary refill.  Lab work from 08/07/2022 was reviewed today in the office: Complements within normal limits, sed rate within normal limits, double-stranded DNA-31-trending up, and no proteinuria.  The following lab work will be updated today for further evaluation.  She will remain on Plaquenil as prescribed.  She was advised to notify us if she develops any signs or symptoms of a flare.  She will follow-up in the office in 5 months or sooner if needed. - Plan: Protein / creatinine ratio, urine, CBC with Differential/Platelet, COMPLETE METABOLIC PANEL WITH GFR, Anti-DNA antibody, double-stranded, Sedimentation rate, C3 and C4, VITAMIN D 25 Hydroxy (Vit-D Deficiency, Fractures)  High risk medication use - Plaquenil 200 mg 1 tablet by mouth twice daily.  PLQ Eye Exam: 04/18/2022  WNL @ [facility] follow up in 12 months.  CMP updated on 12/06/22.   CBC updated on 11/12/22.    - Plan: CBC with Differential/Platelet, COMPLETE METABOLIC PANEL WITH GFR  Primary osteoarthritis of both hands: No tenderness or synovitis noted.  Complete fist formation bilaterally.  Trochanteric bursitis of both hips: She has intermittent discomfort on the lateral aspect of both hips consistent with trochanteric bursitis.  She has tenderness over the left trochanteric bursa today.  She has been performing stretching exercises for symptomatic relief.  Bilateral primary osteoarthritis of knee: No warmth or effusion noted.   Primary osteoarthritis of both feet: She is not experiencing any discomfort in her feet at this time.  Good ROM of both ankles with no tenderness or joint swelling.   Lumbar facet arthropathy: Not currently symptomatic.  No symptoms of radiculopathy.   Essential hypertension - Followed Dr. Glenna Fellows in nephrology.  BP was 106/74 today in the office.   Orders: Orders Placed This Encounter  Procedures   Protein / creatinine ratio, urine   CBC with Differential/Platelet   COMPLETE METABOLIC PANEL WITH GFR   Anti-DNA antibody, double-stranded   Sedimentation rate   C3 and C4   VITAMIN D 25 Hydroxy (Vit-D Deficiency, Fractures)   No orders of the defined types were placed in this encounter.    Follow-Up Instructions: Return in about 5 months (around 06/07/2023) for Systemic lupus erythematosus, Osteoarthritis.   Gearldine Bienenstock, PA-C  Note - This record has been created using Dragon software.  Chart creation errors have been sought, but may not always  have been located. Such creation errors do not reflect on  the standard of medical care.

## 2023-01-07 ENCOUNTER — Encounter: Payer: Self-pay | Admitting: Physician Assistant

## 2023-01-07 ENCOUNTER — Ambulatory Visit: Payer: BC Managed Care – PPO | Attending: Physician Assistant | Admitting: Physician Assistant

## 2023-01-07 VITALS — BP 106/74 | HR 90 | Ht 65.0 in | Wt 174.0 lb

## 2023-01-07 DIAGNOSIS — I10 Essential (primary) hypertension: Secondary | ICD-10-CM

## 2023-01-07 DIAGNOSIS — M19071 Primary osteoarthritis, right ankle and foot: Secondary | ICD-10-CM

## 2023-01-07 DIAGNOSIS — M7061 Trochanteric bursitis, right hip: Secondary | ICD-10-CM

## 2023-01-07 DIAGNOSIS — M3219 Other organ or system involvement in systemic lupus erythematosus: Secondary | ICD-10-CM

## 2023-01-07 DIAGNOSIS — M47816 Spondylosis without myelopathy or radiculopathy, lumbar region: Secondary | ICD-10-CM

## 2023-01-07 DIAGNOSIS — M19041 Primary osteoarthritis, right hand: Secondary | ICD-10-CM | POA: Diagnosis not present

## 2023-01-07 DIAGNOSIS — M170 Bilateral primary osteoarthritis of knee: Secondary | ICD-10-CM

## 2023-01-07 DIAGNOSIS — M19072 Primary osteoarthritis, left ankle and foot: Secondary | ICD-10-CM

## 2023-01-07 DIAGNOSIS — Z79899 Other long term (current) drug therapy: Secondary | ICD-10-CM | POA: Diagnosis not present

## 2023-01-07 DIAGNOSIS — M19042 Primary osteoarthritis, left hand: Secondary | ICD-10-CM

## 2023-01-07 DIAGNOSIS — M7062 Trochanteric bursitis, left hip: Secondary | ICD-10-CM

## 2023-01-10 LAB — CBC WITH DIFFERENTIAL/PLATELET
Absolute Lymphocytes: 741 {cells}/uL — ABNORMAL LOW (ref 850–3900)
Absolute Monocytes: 198 {cells}/uL — ABNORMAL LOW (ref 200–950)
Basophils Absolute: 21 {cells}/uL (ref 0–200)
Basophils Relative: 0.8 %
Eosinophils Absolute: 0 {cells}/uL — ABNORMAL LOW (ref 15–500)
Eosinophils Relative: 0 %
HCT: 34.8 % — ABNORMAL LOW (ref 35.0–45.0)
Hemoglobin: 10.2 g/dL — ABNORMAL LOW (ref 11.7–15.5)
MCH: 22.4 pg — ABNORMAL LOW (ref 27.0–33.0)
MCHC: 29.3 g/dL — ABNORMAL LOW (ref 32.0–36.0)
MCV: 76.3 fL — ABNORMAL LOW (ref 80.0–100.0)
MPV: 11.1 fL (ref 7.5–12.5)
Monocytes Relative: 7.6 %
Neutro Abs: 1641 {cells}/uL (ref 1500–7800)
Neutrophils Relative %: 63.1 %
Platelets: 247 10*3/uL (ref 140–400)
RBC: 4.56 10*6/uL (ref 3.80–5.10)
RDW: 14.1 % (ref 11.0–15.0)
Total Lymphocyte: 28.5 %
WBC: 2.6 10*3/uL — ABNORMAL LOW (ref 3.8–10.8)

## 2023-01-10 LAB — COMPLETE METABOLIC PANEL WITH GFR
AG Ratio: 1.1 (calc) (ref 1.0–2.5)
ALT: 11 U/L (ref 6–29)
AST: 18 U/L (ref 10–35)
Albumin: 4.5 g/dL (ref 3.6–5.1)
Alkaline phosphatase (APISO): 66 U/L (ref 37–153)
BUN/Creatinine Ratio: 12 (calc) (ref 6–22)
BUN: 16 mg/dL (ref 7–25)
CO2: 28 mmol/L (ref 20–32)
Calcium: 10.4 mg/dL (ref 8.6–10.4)
Chloride: 101 mmol/L (ref 98–110)
Creat: 1.33 mg/dL — ABNORMAL HIGH (ref 0.50–1.03)
Globulin: 4 g/dL — ABNORMAL HIGH (ref 1.9–3.7)
Glucose, Bld: 79 mg/dL (ref 65–99)
Potassium: 4.2 mmol/L (ref 3.5–5.3)
Sodium: 136 mmol/L (ref 135–146)
Total Bilirubin: 0.3 mg/dL (ref 0.2–1.2)
Total Protein: 8.5 g/dL — ABNORMAL HIGH (ref 6.1–8.1)
eGFR: 47 mL/min/{1.73_m2} — ABNORMAL LOW (ref >=60)

## 2023-01-10 LAB — VITAMIN D 25 HYDROXY (VIT D DEFICIENCY, FRACTURES): Vit D, 25-Hydroxy: 51 ng/mL (ref 30–100)

## 2023-01-10 LAB — C3 AND C4
C3 Complement: 101 mg/dL (ref 83–193)
C4 Complement: 32 mg/dL (ref 15–57)

## 2023-01-10 LAB — PROTEIN / CREATININE RATIO, URINE
Creatinine, Urine: 91 mg/dL (ref 20–275)
Protein/Creat Ratio: 77 mg/g{creat} (ref 24–184)
Protein/Creatinine Ratio: 0.077 mg/mg{creat} (ref 0.024–0.184)
Total Protein, Urine: 7 mg/dL (ref 5–24)

## 2023-01-10 LAB — SEDIMENTATION RATE: Sed Rate: 14 mm/h (ref 0–30)

## 2023-01-10 LAB — ANTI-DNA ANTIBODY, DOUBLE-STRANDED: ds DNA Ab: 18 [IU]/mL — ABNORMAL HIGH

## 2023-01-10 NOTE — Progress Notes (Signed)
WBC count remains low but has improved slightly.  Anemia has improved.   Creatinine remains elevated-1.33 and GFR is low-47. Rest of CMP stable. Avoid the use of NSAIDs and forward results to nephrology.   ESR WNL  Complements WNL Urine protein creatinine ratio WNL  Vitamin D WNL  dsDNA is pending

## 2023-01-11 NOTE — Progress Notes (Signed)
dsDNA remains positive but has improved.  Continue current dose of plaquenil.

## 2023-01-13 DIAGNOSIS — R457 State of emotional shock and stress, unspecified: Secondary | ICD-10-CM | POA: Diagnosis not present

## 2023-01-13 DIAGNOSIS — F411 Generalized anxiety disorder: Secondary | ICD-10-CM | POA: Diagnosis not present

## 2023-01-13 DIAGNOSIS — R55 Syncope and collapse: Secondary | ICD-10-CM | POA: Diagnosis not present

## 2023-01-13 DIAGNOSIS — R000 Tachycardia, unspecified: Secondary | ICD-10-CM | POA: Diagnosis not present

## 2023-01-13 DIAGNOSIS — R002 Palpitations: Secondary | ICD-10-CM | POA: Diagnosis not present

## 2023-01-13 DIAGNOSIS — F419 Anxiety disorder, unspecified: Secondary | ICD-10-CM | POA: Diagnosis not present

## 2023-01-13 DIAGNOSIS — R202 Paresthesia of skin: Secondary | ICD-10-CM | POA: Diagnosis not present

## 2023-01-14 DIAGNOSIS — R55 Syncope and collapse: Secondary | ICD-10-CM | POA: Diagnosis not present

## 2023-02-19 DIAGNOSIS — R55 Syncope and collapse: Secondary | ICD-10-CM | POA: Diagnosis not present

## 2023-02-21 DIAGNOSIS — M328 Other forms of systemic lupus erythematosus: Secondary | ICD-10-CM | POA: Diagnosis not present

## 2023-02-21 DIAGNOSIS — Z79899 Other long term (current) drug therapy: Secondary | ICD-10-CM | POA: Diagnosis not present

## 2023-03-09 ENCOUNTER — Other Ambulatory Visit: Payer: Self-pay | Admitting: Physician Assistant

## 2023-03-11 NOTE — Telephone Encounter (Signed)
 Patient advised her GFR is low. Patient advised we will get a Plaquenil level with the next labs. Plaquenil level should be checked after skipping the Plaquenil dose in the morning. Patient expressed understanding.

## 2023-03-11 NOTE — Telephone Encounter (Signed)
 Patient's GFR is low.  Please let Plaquenil level with the next labs.  Plaquenil level should be checked after skipping the Plaquenil dose in the morning.

## 2023-03-11 NOTE — Telephone Encounter (Signed)
 Last Fill: 12/20/2022  Eye exam: 04/18/2022   Labs: 01/07/2023 WBC count remains low but has improved slightly.  Anemia has improved.   Creatinine remains elevated-1.33 and GFR is low-47. Rest of CMP stable. Avoid the use of NSAIDs and forward results to nephrology.   ESR WNL Complements WNL Urine protein creatinine ratio WNL Vitamin D WNL dsDNA remains positive but has improved.  Continue current dose of plaquenil.   Next Visit: 06/13/2023  Last Visit: 01/07/2023  DX:Other organ or system involvement in systemic lupus erythematosus   Current Dose per office note on 01/07/2023: Plaquenil 200 mg 1 tablet by mouth twice daily.   Okay to refill Plaquenil?

## 2023-04-04 DIAGNOSIS — I10 Essential (primary) hypertension: Secondary | ICD-10-CM | POA: Diagnosis not present

## 2023-04-04 DIAGNOSIS — E1129 Type 2 diabetes mellitus with other diabetic kidney complication: Secondary | ICD-10-CM | POA: Diagnosis not present

## 2023-04-04 DIAGNOSIS — E785 Hyperlipidemia, unspecified: Secondary | ICD-10-CM | POA: Diagnosis not present

## 2023-04-04 DIAGNOSIS — Z1159 Encounter for screening for other viral diseases: Secondary | ICD-10-CM | POA: Diagnosis not present

## 2023-04-04 DIAGNOSIS — Z0000 Encounter for general adult medical examination without abnormal findings: Secondary | ICD-10-CM | POA: Diagnosis not present

## 2023-04-04 DIAGNOSIS — N1831 Chronic kidney disease, stage 3a: Secondary | ICD-10-CM | POA: Diagnosis not present

## 2023-04-04 DIAGNOSIS — R809 Proteinuria, unspecified: Secondary | ICD-10-CM | POA: Diagnosis not present

## 2023-04-05 DIAGNOSIS — E119 Type 2 diabetes mellitus without complications: Secondary | ICD-10-CM | POA: Diagnosis not present

## 2023-04-05 DIAGNOSIS — E1129 Type 2 diabetes mellitus with other diabetic kidney complication: Secondary | ICD-10-CM | POA: Diagnosis not present

## 2023-04-05 DIAGNOSIS — R809 Proteinuria, unspecified: Secondary | ICD-10-CM | POA: Diagnosis not present

## 2023-04-05 DIAGNOSIS — E785 Hyperlipidemia, unspecified: Secondary | ICD-10-CM | POA: Diagnosis not present

## 2023-04-05 DIAGNOSIS — Z0000 Encounter for general adult medical examination without abnormal findings: Secondary | ICD-10-CM | POA: Diagnosis not present

## 2023-04-05 DIAGNOSIS — M329 Systemic lupus erythematosus, unspecified: Secondary | ICD-10-CM | POA: Diagnosis not present

## 2023-04-16 DIAGNOSIS — E042 Nontoxic multinodular goiter: Secondary | ICD-10-CM | POA: Diagnosis not present

## 2023-04-16 DIAGNOSIS — E041 Nontoxic single thyroid nodule: Secondary | ICD-10-CM | POA: Diagnosis not present

## 2023-05-01 DIAGNOSIS — M329 Systemic lupus erythematosus, unspecified: Secondary | ICD-10-CM | POA: Diagnosis not present

## 2023-05-01 DIAGNOSIS — E1122 Type 2 diabetes mellitus with diabetic chronic kidney disease: Secondary | ICD-10-CM | POA: Diagnosis not present

## 2023-05-01 DIAGNOSIS — I129 Hypertensive chronic kidney disease with stage 1 through stage 4 chronic kidney disease, or unspecified chronic kidney disease: Secondary | ICD-10-CM | POA: Diagnosis not present

## 2023-05-01 DIAGNOSIS — N1831 Chronic kidney disease, stage 3a: Secondary | ICD-10-CM | POA: Diagnosis not present

## 2023-06-03 NOTE — Progress Notes (Deleted)
 Office Visit Note  Patient: Stephanie Myers             Date of Birth: [birth year]           MRN: [id number]             PCP: Jayne Mews, MD Referring: Jayne Mews, MD Visit Date: 06/13/2023 Occupation: @GUAROCC @  Subjective:  No chief complaint on file.   History of Present Illness: Stephanie Myers is a 58 y.o. female ***     Activities of Daily Living:  Patient reports morning stiffness for *** {minute/hour:19697}.   Patient {ACTIONS;DENIES/REPORTS:21021675::"Denies"} nocturnal pain.  Difficulty dressing/grooming: {ACTIONS;DENIES/REPORTS:21021675::"Denies"} Difficulty climbing stairs: {ACTIONS;DENIES/REPORTS:21021675::"Denies"} Difficulty getting out of chair: {ACTIONS;DENIES/REPORTS:21021675::"Denies"} Difficulty using hands for taps, buttons, cutlery, and/or writing: {ACTIONS;DENIES/REPORTS:21021675::"Denies"}  No Rheumatology ROS completed.   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   Osteoarthritis of foot 12/20/2015   Bilateral primary osteoarthritis of knee 12/20/2015    Past Medical History:  Diagnosis Date   Alpha thalassemia trait    Anemia    Diabetes mellitus without complication (HCC)    Hyperlipidemia    Hypertension    Lupus    Shingles    Systemic lupus erythematosus (HCC)     Family History  Problem Relation Age of Onset   Healthy Daughter    Healthy Son    Past Surgical History:  Procedure Laterality Date   ABDOMINAL HYSTERECTOMY     CESAREAN SECTION     Social History   Social History Narrative   Not on file   Immunization History  Administered Date(s) Administered   Hepatitis B 12/05/2016, 06/04/2017, 12/26/2017   Influenza,inj,Quad PF,6+ Mos 10/07/2015, 12/05/2016, 12/26/2017   Moderna Covid-19 Fall Seasonal  Vaccine 61yrs & older 12/08/2022   PFIZER Comirnaty(Gray Top)Covid-19 Tri-Sucrose Vaccine 04/05/2019, 04/26/2019   Pneumococcal Polysaccharide-23 03/05/2011   Tdap 03/05/2011     Objective: Vital Signs: There were no vitals taken for this visit.   Physical Exam   Musculoskeletal Exam: ***  CDAI Exam: CDAI Score: -- Patient Global: --; Provider Global: -- Swollen: --; Tender: -- Joint Exam 06/13/2023   No joint exam has been documented for this visit   There is currently no information documented on the homunculus. Go to the Rheumatology activity and complete the homunculus joint exam.  Investigation: No additional findings.  Imaging: No results found.  Recent Labs: Lab Results  Component Value Date   WBC 2.6 (L) 01/07/2023   HGB 10.2 (L) 01/07/2023   PLT 247 01/07/2023   NA 136 01/07/2023   K 4.2 01/07/2023   CL 101 01/07/2023   CO2 28 01/07/2023   GLUCOSE 79 01/07/2023   BUN 16 01/07/2023   CREATININE 1.33 (H) 01/07/2023   BILITOT 0.3 01/07/2023   ALKPHOS 78 04/02/2022   AST 18 01/07/2023   ALT 11 01/07/2023   PROT 8.5 (H) 01/07/2023   ALBUMIN 4.2 04/02/2022   CALCIUM 10.4 01/07/2023   GFRAA 65 07/05/2020    Speciality Comments: PLQ Eye Exam: 04/18/2022 WNL @ [facility] follow up in 12 months  (Can also view in Care Everywhere)  Procedures:  No procedures performed Allergies: Lisinopril   Assessment / Plan:     Visit Diagnoses: No diagnosis found.  Orders: No orders of the defined types were placed in  this encounter.  No orders of the defined types were placed in this encounter.   Face-to-face time spent with patient was *** minutes. Greater than 50% of time was spent in counseling and coordination of care.  Follow-Up Instructions: No follow-ups on file.   Dee Farber, CMA  Note - This record has been created using Animal nutritionist.  Chart creation errors have been sought, but may not always  have been located. Such creation errors do  not reflect on  the standard of medical care.

## 2023-06-12 NOTE — Progress Notes (Unsigned)
 Office Visit Note  Patient: Stephanie Myers             Date of Birth: 03/11/1965           MRN: [id number]             PCP: Jayne Mews, MD Referring: Jayne Mews, MD Visit Date: 06/26/2023 Occupation: @GUAROCC @  Subjective:  Medication monitoring  History of Present Illness: Stephanie Myers is a 58 y.o. female with history of systemic lupus and osteoarthritis.  Patient remains on  Plaquenil  200 mg 1 tablet by mouth twice daily.  She is tolerating Plaquenil  without any side effects and has not had any recent gaps in therapy.  She denies any signs or symptoms of a systemic lupus flare.  She denies any joint pain or joint swelling at this time.  Her energy level has been stable.  She denies any oral or nasal ulcerations.  She denies any sicca symptoms.  She denies any recent rashes.  She has been trying to avoid direct sun exposure. She has occasional discomfort on the lateral aspect of both hips due to trochanteric bursitis.  She denies any groin pain.    Activities of Daily Living:  Patient reports morning stiffness for 2-3 minutes.   Patient Denies nocturnal pain.  Difficulty dressing/grooming: Denies Difficulty climbing stairs: Denies Difficulty getting out of chair: Denies Difficulty using hands for taps, buttons, cutlery, and/or writing: Denies  Review of Systems  Constitutional:  Positive for fatigue.  HENT:  Negative for mouth sores and mouth dryness.   Eyes:  Negative for dryness.  Respiratory:  Negative for shortness of breath.   Cardiovascular:  Negative for chest pain and palpitations.  Gastrointestinal:  Negative for blood in stool, constipation and diarrhea.  Endocrine: Negative for increased urination.  Genitourinary:  Negative for involuntary urination.  Musculoskeletal:  Positive for joint pain, joint pain and morning stiffness. Negative for gait problem, joint swelling, myalgias, muscle weakness, muscle tenderness and myalgias.  Skin:  Positive for  sensitivity to sunlight. Negative for color change, rash and hair loss.  Allergic/Immunologic: Negative for susceptible to infections.  Neurological:  Negative for dizziness and headaches.  Hematological:  Negative for swollen glands.  Psychiatric/Behavioral:  Negative for depressed mood and sleep disturbance. The patient is nervous/anxious.     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   Osteoarthritis of foot 12/20/2015   Bilateral primary osteoarthritis of knee 12/20/2015    Past Medical History:  Diagnosis Date   Alpha thalassemia trait    Anemia    Diabetes mellitus without complication (HCC)    Hyperlipidemia    Hypertension    Lupus    Shingles    Systemic lupus erythematosus (HCC)     Family History  Problem Relation Age of Onset   Healthy Daughter    Healthy Son    Past Surgical History:  Procedure Laterality Date   ABDOMINAL HYSTERECTOMY     CESAREAN SECTION     Social History   Social History Narrative   Not on file   Immunization History  Administered Date(s) Administered   Hepatitis B 12/05/2016, 06/04/2017, 12/26/2017   Influenza,inj,Quad PF,6+ Mos 10/07/2015, 12/05/2016, 12/26/2017   Moderna Covid-19 Fall Seasonal Vaccine 65yrs & older 12/08/2022   PFIZER Comirnaty(Gray Top)Covid-19 Tri-Sucrose Vaccine  04/05/2019, 04/26/2019   Pneumococcal Polysaccharide-23 03/05/2011   Tdap 03/05/2011     Objective: Vital Signs: BP 131/87 (BP Location: Left Arm, Patient Position: Sitting, Cuff Size: Normal)   Pulse 94   Resp 16   Ht 5' 4 (1.626 m)   Wt 172 lb (78 kg)   BMI 29.52 kg/m    Physical Exam Vitals and nursing note reviewed.  Constitutional:      Appearance: She is well-developed.  HENT:     Head: Normocephalic and atraumatic.   Eyes:     Conjunctiva/sclera: Conjunctivae normal.  Cardiovascular:     Rate and Rhythm: Normal rate and regular rhythm.     Heart sounds: Normal heart sounds.  Pulmonary:     Effort: Pulmonary effort is normal.     Breath sounds: Normal breath sounds.  Abdominal:     General: Bowel sounds are normal.     Palpations: Abdomen is soft.  Musculoskeletal:     Cervical back: Normal range of motion.  Lymphadenopathy:     Cervical: No cervical adenopathy.  Skin:    General: Skin is warm and dry.     Capillary Refill: Capillary refill takes less than 2 seconds.  Neurological:     Mental Status: She is alert and oriented to person, place, and time.  Psychiatric:        Behavior: Behavior normal.      Musculoskeletal Exam: C-spine, thoracic spine, lumbar spine good range of motion.  Shoulder joints, elbow joints, wrist joints, MCPs, PIPs, DIPs have good range of motion with no synovitis.  Complete fist formation bilaterally.  Hip joints have good range of motion with no groin pain.  Knee joints have good range of motion no warmth or effusion.  Ankle joints have good range of motion with no tenderness or joint swelling.  CDAI Exam: CDAI Score: -- Patient Global: --; Provider Global: -- Swollen: --; Tender: -- Joint Exam 06/26/2023   No joint exam has been documented for this visit   There is currently no information documented on the homunculus. Go to the Rheumatology activity and complete the homunculus joint exam.  Investigation: No additional findings.  Imaging: No results found.  Recent Labs: Lab Results  Component Value Date   WBC 2.6 (L) 01/07/2023   HGB 10.2 (L) 01/07/2023   PLT 247 01/07/2023   NA 136 01/07/2023   K 4.2 01/07/2023   CL 101 01/07/2023   CO2 28 01/07/2023   GLUCOSE 79 01/07/2023   BUN 16 01/07/2023   CREATININE 1.33 (H) 01/07/2023   BILITOT 0.3 01/07/2023   ALKPHOS 78 04/02/2022   AST 18 01/07/2023   ALT 11 01/07/2023   PROT 8.5 (H) 01/07/2023    ALBUMIN 4.2 04/02/2022   CALCIUM 10.4 01/07/2023   GFRAA 65 07/05/2020    Speciality Comments: PLQ Eye Exam: 02/21/2023 WNL @ [facility] follow up in 6 months  (Can also view in Care Everywhere)  Procedures:  No procedures performed Allergies: Lisinopril     Assessment / Plan:     Visit Diagnoses: Other organ or system involvement in systemic lupus erythematosus (HCC) - +ANA, +ds DNA, + Smith, RNP and Ro: She has not had any signs or symptoms of a systemic lupus flare.  She has clinically been doing well taking Plaquenil  200 mg 1 tablet by mouth twice daily.  She is tolerating Plaquenil  without any side effects and has not had any recent gaps in therapy.  No synovitis was noted on examination today.  She has not had any recent rashes or signs of alopecia.  No oral or nasal ulcerations.  No sicca symptoms.  No symptoms of Raynaud's phenomenon.  No signs of sclerodactyly noted. Discussed the importance of avoiding direct sun exposure and wearing sunscreen SPF 50 on a daily basis. Lab work from 01/07/23: dsDNA 18, ESR WNL, and complements WNL.  White blood cell count has remained low.  Plan to obtain the following lab work today for further evaluation.  She will remain on Plaquenil  as prescribed.  She was advised to notify us  if she develops signs or symptoms of a flare.  She will follow-up in the office in 5 months or sooner if needed. - Plan: Protein / creatinine ratio, urine, CBC with Differential/Platelet, C3 and C4, Sedimentation rate, Comprehensive metabolic panel with GFR, Anti-DNA antibody, double-stranded  High risk medication use - Plaquenil  200 mg 1 tablet by mouth twice daily.  PLQ Eye Exam: 02/21/2023 WNL @ [facility]  CBC and CMP updated on 04/04/23. Orders for CBC and CMP released today. - Plan: CBC with Differential/Platelet, Comprehensive metabolic panel with GFR  Primary osteoarthritis of both hands: No tenderness or synovitis noted.  Complete fist formation  bilaterally.  Trochanteric bursitis of both hips: She experiences intermittent discomfort on the lateral aspect of both hips consistent with trochanter bursitis.  She has good range of motion of both hip joints on examination today.  No groin pain.  No tenderness over the trochanteric bursa currently.  Bilateral primary osteoarthritis of knee: She has good range of motion of both knee joints on examination today.  No warmth or effusion noted.  Primary osteoarthritis of both feet: She has good range of motion of both ankle joints with no tenderness or joint swelling.  Lumbar facet arthropathy: She is not experiencing any increased discomfort in her lower back at this time.  No symptoms of radiculopathy.  Essential hypertension - Followed Dr. Higinio Love in nephrology.  Blood pressure was 131/87 today in the office.  Orders: Orders Placed This Encounter  Procedures   Protein / creatinine ratio, urine   CBC with Differential/Platelet   C3 and C4   Sedimentation rate   Comprehensive metabolic panel with GFR   Anti-DNA antibody, double-stranded   No orders of the defined types were placed in this encounter.    Follow-Up Instructions: Return in about 5 months (around 11/26/2023) for Systemic lupus erythematosus, Osteoarthritis.   Romayne Clubs, PA-C  Note - This record has been created using Dragon software.  Chart creation errors have been sought, but may not always  have been located. Such creation errors do not reflect on  the standard of medical care.

## 2023-06-13 ENCOUNTER — Ambulatory Visit: Payer: BC Managed Care – PPO | Admitting: Rheumatology

## 2023-06-25 ENCOUNTER — Other Ambulatory Visit: Payer: Self-pay | Admitting: Rheumatology

## 2023-06-25 NOTE — Telephone Encounter (Signed)
 Last Fill: 03/11/2023  Eye exam: 04/18/2022 WNL   Labs: 04/04/2023 WBC 2.2, Hgb 10.6, Hct 33.7, MCV 71, MCH 22.4, MCHC 31.6, Neutrophils Absolute 1.3, Lymphocytes # 0.7, Glucose 135, Creat. 1.31, GFR 48, Calcium 10.4  Next Visit: 06/26/2023  Last Visit: 01/07/2023  DX:Other organ or system involvement in systemic lupus erythematosus   Current Dose per office note 01/07/2023: Plaquenil  200 mg 1 tablet by mouth twice daily.   Okay to refill Plaquenil ?

## 2023-06-26 ENCOUNTER — Ambulatory Visit: Attending: Physician Assistant | Admitting: Physician Assistant

## 2023-06-26 ENCOUNTER — Encounter: Payer: Self-pay | Admitting: Physician Assistant

## 2023-06-26 VITALS — BP 131/87 | HR 94 | Resp 16 | Ht 64.0 in | Wt 172.0 lb

## 2023-06-26 DIAGNOSIS — Z79899 Other long term (current) drug therapy: Secondary | ICD-10-CM | POA: Diagnosis not present

## 2023-06-26 DIAGNOSIS — M19041 Primary osteoarthritis, right hand: Secondary | ICD-10-CM | POA: Diagnosis not present

## 2023-06-26 DIAGNOSIS — M19072 Primary osteoarthritis, left ankle and foot: Secondary | ICD-10-CM

## 2023-06-26 DIAGNOSIS — M47816 Spondylosis without myelopathy or radiculopathy, lumbar region: Secondary | ICD-10-CM

## 2023-06-26 DIAGNOSIS — M7062 Trochanteric bursitis, left hip: Secondary | ICD-10-CM

## 2023-06-26 DIAGNOSIS — M3219 Other organ or system involvement in systemic lupus erythematosus: Secondary | ICD-10-CM

## 2023-06-26 DIAGNOSIS — M19071 Primary osteoarthritis, right ankle and foot: Secondary | ICD-10-CM

## 2023-06-26 DIAGNOSIS — M7061 Trochanteric bursitis, right hip: Secondary | ICD-10-CM | POA: Diagnosis not present

## 2023-06-26 DIAGNOSIS — M19042 Primary osteoarthritis, left hand: Secondary | ICD-10-CM

## 2023-06-26 DIAGNOSIS — I10 Essential (primary) hypertension: Secondary | ICD-10-CM

## 2023-06-26 DIAGNOSIS — M170 Bilateral primary osteoarthritis of knee: Secondary | ICD-10-CM

## 2023-06-27 LAB — CBC WITH DIFFERENTIAL/PLATELET
Absolute Lymphocytes: 687 {cells}/uL — ABNORMAL LOW (ref 850–3900)
Absolute Monocytes: 180 {cells}/uL — ABNORMAL LOW (ref 200–950)
Basophils Absolute: 29 {cells}/uL (ref 0–200)
Basophils Relative: 1 %
Eosinophils Absolute: 41 {cells}/uL (ref 15–500)
Eosinophils Relative: 1.4 %
HCT: 35.5 % (ref 35.0–45.0)
Hemoglobin: 10.5 g/dL — ABNORMAL LOW (ref 11.7–15.5)
MCH: 22.7 pg — ABNORMAL LOW (ref 27.0–33.0)
MCHC: 29.6 g/dL — ABNORMAL LOW (ref 32.0–36.0)
MCV: 76.8 fL — ABNORMAL LOW (ref 80.0–100.0)
MPV: 10.7 fL (ref 7.5–12.5)
Monocytes Relative: 6.2 %
Neutro Abs: 1963 {cells}/uL (ref 1500–7800)
Neutrophils Relative %: 67.7 %
Platelets: 260 10*3/uL (ref 140–400)
RBC: 4.62 10*6/uL (ref 3.80–5.10)
RDW: 14.1 % (ref 11.0–15.0)
Total Lymphocyte: 23.7 %
WBC: 2.9 10*3/uL — ABNORMAL LOW (ref 3.8–10.8)

## 2023-06-27 LAB — COMPREHENSIVE METABOLIC PANEL WITH GFR
AG Ratio: 1.2 (calc) (ref 1.0–2.5)
ALT: 23 U/L (ref 6–29)
AST: 21 U/L (ref 10–35)
Albumin: 4.3 g/dL (ref 3.6–5.1)
Alkaline phosphatase (APISO): 72 U/L (ref 37–153)
BUN: 16 mg/dL (ref 7–25)
CO2: 28 mmol/L (ref 20–32)
Calcium: 10.2 mg/dL (ref 8.6–10.4)
Chloride: 104 mmol/L (ref 98–110)
Creat: 0.99 mg/dL (ref 0.50–1.03)
Globulin: 3.6 g/dL (ref 1.9–3.7)
Glucose, Bld: 116 mg/dL — ABNORMAL HIGH (ref 65–99)
Potassium: 4 mmol/L (ref 3.5–5.3)
Sodium: 138 mmol/L (ref 135–146)
Total Bilirubin: 0.2 mg/dL (ref 0.2–1.2)
Total Protein: 7.9 g/dL (ref 6.1–8.1)
eGFR: 67 mL/min/{1.73_m2} (ref >=60)

## 2023-06-27 LAB — PROTEIN / CREATININE RATIO, URINE
Creatinine, Urine: 56 mg/dL (ref 20–275)
Protein/Creat Ratio: 71 mg/g{creat} (ref 24–184)
Protein/Creatinine Ratio: 0.071 mg/mg{creat} (ref 0.024–0.184)
Total Protein, Urine: 4 mg/dL — ABNORMAL LOW (ref 5–24)

## 2023-06-27 LAB — SEDIMENTATION RATE: Sed Rate: 6 mm/h (ref 0–30)

## 2023-06-27 LAB — ANTI-DNA ANTIBODY, DOUBLE-STRANDED: ds DNA Ab: 16 [IU]/mL — ABNORMAL HIGH

## 2023-06-28 ENCOUNTER — Ambulatory Visit: Payer: Self-pay | Admitting: Physician Assistant

## 2023-06-28 NOTE — Progress Notes (Signed)
 No proteinuria  WBC count remains low but has improved-2.9.  absolute lymphocytes and monocytes are low.   Hemoglobin remains low but is improving.   Glucose is 116. Rest of CMP WNL ESR WNL dsDNA remains positive but continues to improve-16.

## 2023-06-28 NOTE — Progress Notes (Signed)
Complements WNL

## 2023-08-21 DIAGNOSIS — M329 Systemic lupus erythematosus, unspecified: Secondary | ICD-10-CM | POA: Diagnosis not present

## 2023-08-21 DIAGNOSIS — Z79899 Other long term (current) drug therapy: Secondary | ICD-10-CM | POA: Diagnosis not present

## 2023-08-21 DIAGNOSIS — E119 Type 2 diabetes mellitus without complications: Secondary | ICD-10-CM | POA: Diagnosis not present

## 2023-08-21 DIAGNOSIS — Z7984 Long term (current) use of oral hypoglycemic drugs: Secondary | ICD-10-CM | POA: Diagnosis not present

## 2023-09-21 ENCOUNTER — Other Ambulatory Visit: Payer: Self-pay | Admitting: Rheumatology

## 2023-09-23 NOTE — Telephone Encounter (Signed)
 Last Fill: 06/25/2023  Eye exam: 02/21/2023 WNL   Labs: 06/26/2023 WBC count remains low but has improved-2.9.  absolute lymphocytes and monocytes are low.   Hemoglobin remains low but is improving.   Glucose is 116. Rest of CMP WNL  Next Visit: 11/26/2023  Last Visit: 06/26/2023  DX: Other organ or system involvement in systemic lupus erythematosus   Current Dose per office note 06/26/2023: Plaquenil  200 mg 1 tablet by mouth twice daily   Okay to refill Plaquenil ?

## 2023-11-04 DIAGNOSIS — R809 Proteinuria, unspecified: Secondary | ICD-10-CM | POA: Diagnosis not present

## 2023-11-04 DIAGNOSIS — E1129 Type 2 diabetes mellitus with other diabetic kidney complication: Secondary | ICD-10-CM | POA: Diagnosis not present

## 2023-11-04 DIAGNOSIS — E785 Hyperlipidemia, unspecified: Secondary | ICD-10-CM | POA: Diagnosis not present

## 2023-11-06 DIAGNOSIS — E669 Obesity, unspecified: Secondary | ICD-10-CM | POA: Diagnosis not present

## 2023-11-06 DIAGNOSIS — Z23 Encounter for immunization: Secondary | ICD-10-CM | POA: Diagnosis not present

## 2023-11-06 DIAGNOSIS — E1129 Type 2 diabetes mellitus with other diabetic kidney complication: Secondary | ICD-10-CM | POA: Diagnosis not present

## 2023-11-06 DIAGNOSIS — E785 Hyperlipidemia, unspecified: Secondary | ICD-10-CM | POA: Diagnosis not present

## 2023-11-06 DIAGNOSIS — F411 Generalized anxiety disorder: Secondary | ICD-10-CM | POA: Diagnosis not present

## 2023-11-12 NOTE — Progress Notes (Signed)
 Office Visit Note  Patient: Stephanie Myers             Date of Birth: 1965/08/14           MRN: [id number]             PCP: Millicent Sharper, MD Referring: Millicent Sharper, MD Visit Date: 11/26/2023 Occupation: Data Unavailable  Subjective:  Medication monitoring  History of Present Illness: Stephanie Myers is a 58 y.o. female with history of systemic lupus and osteoarthritis.  Patient remains on  Plaquenil  200 mg 1 tablet by mouth twice daily.  She is tolerating Plaquenil  without any side effects and has not had any gaps in therapy.  She denies any signs or symptoms of a systemic lupus flare.  She has not been experiencing any increased joint pain and has no joint swelling.  She denies any increased fatigue.  She denies any oral nasal ulcerations.  She denies any sicca symptoms.  She has not had any symptoms of Raynaud's phenomenon.  She denies any rash or hair loss.  She denies any shortness of breath, pleuritic chest pain, or palpitations.  She remains on aspirin  81 mg daily. She denies any new medical conditions.  She denies any recent or recurrent infections.      Activities of Daily Living:  Patient reports morning stiffness for a couple of minutes.   Patient Reports nocturnal pain.  Difficulty dressing/grooming: Denies Difficulty climbing stairs: Denies Difficulty getting out of chair: Denies Difficulty using hands for taps, buttons, cutlery, and/or writing: Denies  Review of Systems  Constitutional:  Negative for fatigue.  HENT:  Negative for mouth sores and mouth dryness.   Eyes:  Negative for dryness.  Respiratory:  Negative for shortness of breath.   Cardiovascular:  Negative for chest pain and palpitations.  Gastrointestinal:  Negative for blood in stool, constipation and diarrhea.  Endocrine: Negative for increased urination.  Genitourinary:  Negative for involuntary urination.  Musculoskeletal:  Positive for joint pain, joint pain and morning stiffness. Negative for  gait problem, joint swelling, myalgias, muscle weakness, muscle tenderness and myalgias.  Skin:  Positive for sensitivity to sunlight. Negative for color change, rash and hair loss.  Allergic/Immunologic: Negative for susceptible to infections.  Neurological:  Negative for dizziness and headaches.  Hematological:  Negative for swollen glands.  Psychiatric/Behavioral:  Negative for depressed mood and sleep disturbance. The patient is nervous/anxious.     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   Osteoarthritis of foot 12/20/2015   Bilateral primary osteoarthritis of knee 12/20/2015    Past Medical History:  Diagnosis Date   Alpha thalassemia trait    Anemia    Diabetes mellitus without complication (HCC)    Hyperlipidemia    Hypertension    Lupus    Shingles    Systemic lupus erythematosus (HCC)     Family History  Problem Relation Age of Onset   Healthy Daughter    Healthy Son    Past Surgical History:  Procedure Laterality Date   ABDOMINAL HYSTERECTOMY     CESAREAN SECTION     Social History   Tobacco Use   Smoking status: Former    Current packs/day: 0.00    Average packs/day: 1 pack/day for 7.0 years (7.0 ttl pk-yrs)  Types: Cigarettes    Start date: 05/07/1989    Quit date: 05/07/1996    Years since quitting: 27.5    Passive exposure: Current (minimal)   Smokeless tobacco: Never  Vaping Use   Vaping status: Never Used  Substance Use Topics   Alcohol use: No   Drug use: Never   Social History   Social History Narrative   Not on file     Immunization History  Administered Date(s) Administered   Hepatitis B 12/05/2016, 06/04/2017, 12/26/2017   Influenza,inj,Quad PF,6+ Mos 10/07/2015, 12/05/2016, 12/26/2017   Moderna Covid-19 Fall  Seasonal Vaccine 107yrs & older 12/08/2022   PFIZER Comirnaty(Gray Top)Covid-19 Tri-Sucrose Vaccine 04/05/2019, 04/26/2019   Pneumococcal Polysaccharide-23 03/05/2011   Tdap 03/05/2011     Objective: Vital Signs: BP (!) 160/93 (BP Location: Left Arm, Patient Position: Sitting, Cuff Size: Normal)   Pulse 88   Temp 98.2 F (36.8 C)   Resp 14   Ht 5' 4 (1.626 m)   Wt 181 lb (82.1 kg)   BMI 31.07 kg/m    Physical Exam Vitals and nursing note reviewed.  Constitutional:      Appearance: She is well-developed.  HENT:     Head: Normocephalic and atraumatic.  Eyes:     Conjunctiva/sclera: Conjunctivae normal.  Cardiovascular:     Rate and Rhythm: Normal rate and regular rhythm.     Heart sounds: Normal heart sounds.  Pulmonary:     Effort: Pulmonary effort is normal.     Breath sounds: Normal breath sounds.  Abdominal:     General: Bowel sounds are normal.     Palpations: Abdomen is soft.  Musculoskeletal:     Cervical back: Normal range of motion.  Lymphadenopathy:     Cervical: No cervical adenopathy.  Skin:    General: Skin is warm and dry.     Capillary Refill: Capillary refill takes less than 2 seconds.  Neurological:     Mental Status: She is alert and oriented to person, place, and time.  Psychiatric:        Behavior: Behavior normal.      Musculoskeletal Exam: C-spine, thoracic spine, lumbar spine have good range of motion.  No midline spinal tenderness.  No SI joint tenderness.  Shoulder joints, elbow joints, wrist joints, MCPs, PIPs, DIPs have good range of motion with no synovitis.  Complete fist formation bilaterally.  Hip joints have good range of motion with no groin pain.  Knee joints have good range of motion no warmth or effusion.  Ankle joints have good range of motion no tenderness or joint swelling.     CDAI Exam: CDAI Score: -- Patient Global: --; Provider Global: -- Swollen: --; Tender: -- Joint Exam 11/26/2023   No joint exam has been documented  for this visit   There is currently no information documented on the homunculus. Go to the Rheumatology activity and complete the homunculus joint exam.  Investigation: No additional findings.  Imaging: No results found.  Recent Labs: Lab Results  Component Value Date   WBC 2.9 (L) 06/26/2023   HGB 10.5 (L) 06/26/2023   PLT 260 06/26/2023   NA 138 06/26/2023   K 4.0 06/26/2023   CL 104 06/26/2023   CO2 28 06/26/2023   GLUCOSE 116 (H) 06/26/2023   BUN 16 06/26/2023   CREATININE 0.99 06/26/2023   BILITOT 0.2 06/26/2023   ALKPHOS 78 04/02/2022   AST 21 06/26/2023   ALT 23 06/26/2023   PROT 7.9 06/26/2023   ALBUMIN  4.2 04/02/2022   CALCIUM 10.2 06/26/2023   GFRAA 65 07/05/2020    Speciality Comments: PLQ Eye Exam: 02/21/2023 WNL @ [facility] follow up in 6 months  (Can also view in Care Everywhere) Per patient, Dr. Renate follow her every 6 months but only recommends PLQ testing once yearly.   Procedures:  No procedures performed Allergies: Lisinopril   Assessment / Plan:     Visit Diagnoses: Other organ or system involvement in systemic lupus erythematosus (HCC) - +ANA, +ds DNA, + Smith, RNP and Ro: She has not had any signs or symptoms of a systemic lupus flare.  She has clinically been doing well taking Plaquenil  200 mg 1 tablet by mouth twice daily.  She is tolerating Plaquenil  without any side effects and has not had any gaps in therapy.  She has no synovitis on examination today.  Her energy level has been stable.  She has not had any recent rashes, Raynaud's phenomenon, hair loss, oral or nasal ulcerations, sicca symptoms, shortness of breath, or pleuritic chest pain. Lab work from 06/26/23: no proteinuria, dsDNA 16, ESR WNL, and complements WNL. Plan to update the following lab work today.  She will remain on Plaquenil  as prescribed.  She was advised to notify us  if she develops any signs or symptoms of a flare. She will follow-up in the office in 5 months or sooner if  needed. - Plan: C3 and C4, Sedimentation rate, CBC with Differential/Platelet, Protein / creatinine ratio, urine, Anti-DNA antibody, double-stranded  High risk medication use - Plaquenil  200 mg 1 tablet by mouth twice daily.  CMP updated on 11/04/23.  CBC released today. PLQ Eye Exam: 02/21/2023 WNL @ [facility] follow up in 6 months (Can also view in Care Everywhere) Per patient, Dr. Renate follow her every 6 months but only recommends PLQ testing once yearly.  - Plan: CBC with Differential/Platelet  Primary osteoarthritis of both hands: No tenderness or synovitis noted.  Complete fist formation bilaterally.  Trochanteric bursitis of both hips: Intermittent discomfort when lying on her sides at night.  No tenderness upon palpation today.  Good range of motion of both hip joints with no groin pain.  Bilateral primary osteoarthritis of knee: She has good range of motion in both knee joints on examination today.  No warmth or effusion noted.  Primary osteoarthritis of both feet: She has good ROM of both ankles.  No tenderness or swelling. She is wearing proper fitting shoes.  Lumbar facet arthropathy: No symptoms of radiculopathy.   Other medical conditions are listed as follows:   Essential hypertension - Followed Dr. Norine in nephrology.  Blood pressure was elevated today in the office and was rechecked prior to leaving.  Patient was advised to monitor blood pressure closely and to reach out to PCP if blood pressure remains  Orders: Orders Placed This Encounter  Procedures   C3 and C4   Sedimentation rate   CBC with Differential/Platelet   Protein / creatinine ratio, urine   Anti-DNA antibody, double-stranded   No orders of the defined types were placed in this encounter.    Follow-Up Instructions: Return in about 5 months (around 04/25/2024) for Systemic lupus erythematosus.   Waddell CHRISTELLA Craze, PA-C  Note - This record has been created using Dragon software.  Chart creation  errors have been sought, but may not always  have been located. Such creation errors do not reflect on  the standard of medical care.

## 2023-11-26 ENCOUNTER — Encounter: Payer: Self-pay | Admitting: Physician Assistant

## 2023-11-26 ENCOUNTER — Ambulatory Visit: Attending: Physician Assistant | Admitting: Physician Assistant

## 2023-11-26 VITALS — BP 160/93 | HR 88 | Temp 98.2°F | Resp 14 | Ht 64.0 in | Wt 181.0 lb

## 2023-11-26 DIAGNOSIS — M19042 Primary osteoarthritis, left hand: Secondary | ICD-10-CM

## 2023-11-26 DIAGNOSIS — M3219 Other organ or system involvement in systemic lupus erythematosus: Secondary | ICD-10-CM

## 2023-11-26 DIAGNOSIS — M19072 Primary osteoarthritis, left ankle and foot: Secondary | ICD-10-CM

## 2023-11-26 DIAGNOSIS — I10 Essential (primary) hypertension: Secondary | ICD-10-CM

## 2023-11-26 DIAGNOSIS — M7062 Trochanteric bursitis, left hip: Secondary | ICD-10-CM

## 2023-11-26 DIAGNOSIS — M7061 Trochanteric bursitis, right hip: Secondary | ICD-10-CM | POA: Diagnosis not present

## 2023-11-26 DIAGNOSIS — M19041 Primary osteoarthritis, right hand: Secondary | ICD-10-CM

## 2023-11-26 DIAGNOSIS — Z79899 Other long term (current) drug therapy: Secondary | ICD-10-CM | POA: Diagnosis not present

## 2023-11-26 DIAGNOSIS — M170 Bilateral primary osteoarthritis of knee: Secondary | ICD-10-CM

## 2023-11-26 DIAGNOSIS — M19071 Primary osteoarthritis, right ankle and foot: Secondary | ICD-10-CM

## 2023-11-26 DIAGNOSIS — M47816 Spondylosis without myelopathy or radiculopathy, lumbar region: Secondary | ICD-10-CM

## 2023-11-27 ENCOUNTER — Ambulatory Visit: Payer: Self-pay | Admitting: Physician Assistant

## 2023-11-27 LAB — CBC WITH DIFFERENTIAL/PLATELET
Absolute Lymphocytes: 925 {cells}/uL (ref 850–3900)
Absolute Monocytes: 241 {cells}/uL (ref 200–950)
Basophils Absolute: 29 {cells}/uL (ref 0–200)
Basophils Relative: 1 %
Eosinophils Absolute: 61 {cells}/uL (ref 15–500)
Eosinophils Relative: 2.1 %
HCT: 37.2 % (ref 35.0–45.0)
Hemoglobin: 11 g/dL — ABNORMAL LOW (ref 11.7–15.5)
MCH: 22.5 pg — ABNORMAL LOW (ref 27.0–33.0)
MCHC: 29.6 g/dL — ABNORMAL LOW (ref 32.0–36.0)
MCV: 76.1 fL — ABNORMAL LOW (ref 80.0–100.0)
MPV: 11.2 fL (ref 7.5–12.5)
Monocytes Relative: 8.3 %
Neutro Abs: 1644 {cells}/uL (ref 1500–7800)
Neutrophils Relative %: 56.7 %
Platelets: 250 Thousand/uL (ref 140–400)
RBC: 4.89 Million/uL (ref 3.80–5.10)
RDW: 14.8 % (ref 11.0–15.0)
Total Lymphocyte: 31.9 %
WBC: 2.9 Thousand/uL — ABNORMAL LOW (ref 3.8–10.8)

## 2023-11-27 LAB — C3 AND C4
C3 Complement: 125 mg/dL (ref 83–193)
C4 Complement: 37 mg/dL (ref 15–57)

## 2023-11-27 LAB — PROTEIN / CREATININE RATIO, URINE
Creatinine, Urine: 45 mg/dL (ref 20–275)
Protein/Creat Ratio: 89 mg/g{creat} (ref 24–184)
Protein/Creatinine Ratio: 0.089 mg/mg{creat} (ref 0.024–0.184)
Total Protein, Urine: 4 mg/dL — ABNORMAL LOW (ref 5–24)

## 2023-11-27 LAB — ANTI-DNA ANTIBODY, DOUBLE-STRANDED: ds DNA Ab: 13 [IU]/mL — ABNORMAL HIGH

## 2023-11-27 LAB — SEDIMENTATION RATE: Sed Rate: 11 mm/h (ref 0–30)

## 2023-11-27 NOTE — Progress Notes (Signed)
 Complements WNL dsDNA pending

## 2023-11-27 NOTE — Progress Notes (Signed)
 ESR WNL Urine protein creatinine ratio WNL  WBC count is low-stable.  Hemoglobin remains low but improving.

## 2023-11-28 NOTE — Progress Notes (Signed)
 dsDNA remains positive but continues to trend down-great news!  Labs are not consistent with a flare-continue current treatment regimen.

## 2023-12-02 DIAGNOSIS — Z1231 Encounter for screening mammogram for malignant neoplasm of breast: Secondary | ICD-10-CM | POA: Diagnosis not present

## 2023-12-03 DIAGNOSIS — R202 Paresthesia of skin: Secondary | ICD-10-CM | POA: Diagnosis not present

## 2023-12-22 ENCOUNTER — Other Ambulatory Visit: Payer: Self-pay | Admitting: Physician Assistant

## 2023-12-23 NOTE — Telephone Encounter (Signed)
 Last Fill: 09/23/2023  Eye exam: 02/21/2023 WNL   Labs: 11/04/2023 Creat. 1.24 GFR 51 11/26/2023 ESR WNL Urine protein creatinine ratio WNL  WBC count is low-stable.  Hemoglobin remains low but improving.    Next Visit: 04/28/2024  Last Visit: 11/26/2023  IK:Nuyzm organ or system involvement in systemic lupus erythematosus   Current Dose per office note 11/26/2023: Plaquenil  200 mg 1 tablet by mouth twice daily.   Okay to refill Plaquenil ?

## 2023-12-23 NOTE — Telephone Encounter (Signed)
 Last Fill: 09/23/2023  Eye exam: 02/21/2023 WNL   Labs: 11/26/2023 CBC  WBC 2.9 Hemoglobin 11.0 MCV 76.1 MCH 22.5 MCHC 29.6  11/04/2023 CMP Creatinine 1.24 eGFR 51  Next Visit: 04/28/2024  Last Visit: 11/26/2023  DX: Other organ or system involvement in systemic lupus erythematosus (HCC)   Current Dose per office note 11/26/2023: Plaquenil  200 mg 1 tablet by mouth twice daily.   Okay to refill Plaquenil ?

## 2024-04-28 ENCOUNTER — Ambulatory Visit: Admitting: Physician Assistant
